# Patient Record
Sex: Female | Born: 1985 | Hispanic: Yes | Marital: Married | Smoking: Never smoker
Health system: Southern US, Community
[De-identification: ages and names within clinical notes are randomized; demographics above are authoritative.]

## PROBLEM LIST (undated history)

## (undated) DIAGNOSIS — N12 Tubulo-interstitial nephritis, not specified as acute or chronic: Secondary | ICD-10-CM

## (undated) DIAGNOSIS — Z789 Other specified health status: Secondary | ICD-10-CM

## (undated) HISTORY — PX: COLONOSCOPY: SHX174

## (undated) HISTORY — PX: NO PAST SURGERIES: SHX2092

---
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
## 2007-02-11 ENCOUNTER — Ambulatory Visit (HOSPITAL_COMMUNITY): Admission: RE | Admit: 2007-02-11 | Discharge: 2007-02-11 | Payer: Self-pay | Admitting: Obstetrics & Gynecology

## 2007-02-20 ENCOUNTER — Ambulatory Visit: Payer: Self-pay | Admitting: Gynecology

## 2007-02-27 ENCOUNTER — Ambulatory Visit: Payer: Self-pay | Admitting: Family Medicine

## 2007-03-06 ENCOUNTER — Ambulatory Visit: Payer: Self-pay | Admitting: Obstetrics and Gynecology

## 2007-03-13 ENCOUNTER — Ambulatory Visit: Payer: Self-pay | Admitting: Family Medicine

## 2007-03-20 ENCOUNTER — Ambulatory Visit: Payer: Self-pay | Admitting: Gynecology

## 2007-03-27 ENCOUNTER — Ambulatory Visit: Payer: Self-pay | Admitting: Family Medicine

## 2007-04-03 ENCOUNTER — Ambulatory Visit: Payer: Self-pay | Admitting: Family Medicine

## 2007-04-11 ENCOUNTER — Ambulatory Visit: Payer: Self-pay | Admitting: Obstetrics & Gynecology

## 2007-04-17 ENCOUNTER — Ambulatory Visit: Payer: Self-pay | Admitting: Family Medicine

## 2007-04-24 ENCOUNTER — Ambulatory Visit: Payer: Self-pay | Admitting: Obstetrics & Gynecology

## 2007-05-02 ENCOUNTER — Ambulatory Visit: Payer: Self-pay | Admitting: Obstetrics & Gynecology

## 2007-05-08 ENCOUNTER — Ambulatory Visit: Payer: Self-pay | Admitting: Family Medicine

## 2007-05-15 ENCOUNTER — Ambulatory Visit: Payer: Self-pay | Admitting: Family Medicine

## 2007-05-22 ENCOUNTER — Ambulatory Visit: Payer: Self-pay | Admitting: Family Medicine

## 2007-05-29 ENCOUNTER — Ambulatory Visit: Payer: Self-pay | Admitting: Obstetrics & Gynecology

## 2007-06-05 ENCOUNTER — Ambulatory Visit: Payer: Self-pay | Admitting: Obstetrics & Gynecology

## 2007-06-12 ENCOUNTER — Ambulatory Visit: Payer: Self-pay | Admitting: *Deleted

## 2007-06-19 ENCOUNTER — Ambulatory Visit: Payer: Self-pay | Admitting: Family Medicine

## 2007-06-21 ENCOUNTER — Inpatient Hospital Stay (HOSPITAL_COMMUNITY): Admission: AD | Admit: 2007-06-21 | Discharge: 2007-06-23 | Payer: Self-pay | Admitting: Obstetrics & Gynecology

## 2007-06-21 ENCOUNTER — Ambulatory Visit: Payer: Self-pay | Admitting: Obstetrics and Gynecology

## 2008-02-04 ENCOUNTER — Emergency Department (HOSPITAL_COMMUNITY): Admission: EM | Admit: 2008-02-04 | Discharge: 2008-02-04 | Payer: Self-pay | Admitting: Family Medicine

## 2010-08-27 NOTE — L&D Delivery Note (Signed)
Delivery Note At 3:48 PM a viable and healthy female was delivered via Vaginal, Spontaneous Delivery (Presentation: LOA).  APGAR: 8, 9; weight 5 pounds 11 ounces .   Placenta status: delivered, intact .  Cord: 3 vessels with the following complications: nuchal cord x 1 and around foot x 1 .  Cord pH: not sent after infant began to cry strongly.  Anesthesia: None Episiotomy: None Lacerations: None Est. Blood Loss (mL): 350 mL  Mom to postpartum.  Baby to room in with mother. PA-S Joseph Berkshire present for delivery and assisting.  Tais Koestner N 06/01/2011, 4:10 PM

## 2011-01-08 ENCOUNTER — Other Ambulatory Visit: Payer: Self-pay | Admitting: Family Medicine

## 2011-01-08 DIAGNOSIS — Z3689 Encounter for other specified antenatal screening: Secondary | ICD-10-CM

## 2011-01-08 LAB — HEPATITIS B SURFACE ANTIGEN: Hepatitis B Surface Ag: NEGATIVE

## 2011-01-08 LAB — RUBELLA ANTIBODY, IGM: Rubella: IMMUNE

## 2011-01-08 LAB — ABO/RH: RH Type: POSITIVE

## 2011-01-08 LAB — RPR: RPR: NONREACTIVE

## 2011-01-12 ENCOUNTER — Ambulatory Visit (HOSPITAL_COMMUNITY)
Admission: RE | Admit: 2011-01-12 | Discharge: 2011-01-12 | Disposition: A | Discharge status: Home / Self Care | Payer: Medicaid Other | Source: Ambulatory Visit | Attending: Family Medicine | Admitting: Family Medicine

## 2011-01-12 DIAGNOSIS — Z363 Encounter for antenatal screening for malformations: Secondary | ICD-10-CM | POA: Insufficient documentation

## 2011-01-12 DIAGNOSIS — Z1389 Encounter for screening for other disorder: Secondary | ICD-10-CM | POA: Insufficient documentation

## 2011-01-12 DIAGNOSIS — Z3689 Encounter for other specified antenatal screening: Secondary | ICD-10-CM

## 2011-01-12 DIAGNOSIS — O358XX0 Maternal care for other (suspected) fetal abnormality and damage, not applicable or unspecified: Secondary | ICD-10-CM | POA: Insufficient documentation

## 2011-01-26 ENCOUNTER — Inpatient Hospital Stay (HOSPITAL_COMMUNITY): Admission: AD | Admit: 2011-01-26 | Payer: Self-pay | Source: Ambulatory Visit | Admitting: Family Medicine

## 2011-05-24 LAB — POCT URINALYSIS DIP (DEVICE)
Bilirubin Urine: NEGATIVE
Glucose, UA: NEGATIVE
Ketones, ur: NEGATIVE
Operator id: 239701
Protein, ur: 30 — AB
Specific Gravity, Urine: 1.01

## 2011-05-24 LAB — URINE CULTURE

## 2011-05-24 LAB — POCT PREGNANCY, URINE: Operator id: 239701

## 2011-06-01 ENCOUNTER — Other Ambulatory Visit: Payer: Self-pay | Admitting: Family Medicine

## 2011-06-01 ENCOUNTER — Encounter (HOSPITAL_COMMUNITY): Payer: Self-pay | Admitting: *Deleted

## 2011-06-01 ENCOUNTER — Ambulatory Visit (HOSPITAL_COMMUNITY): Admission: RE | Admit: 2011-06-01 | Payer: Medicaid Other | Source: Ambulatory Visit

## 2011-06-01 ENCOUNTER — Inpatient Hospital Stay (HOSPITAL_COMMUNITY): Admission: RE | Admit: 2011-06-01 | Payer: Medicaid Other | Source: Ambulatory Visit

## 2011-06-01 ENCOUNTER — Inpatient Hospital Stay (HOSPITAL_COMMUNITY)
Admission: AD | Admit: 2011-06-01 | Discharge: 2011-06-03 | DRG: 775 | Disposition: A | Discharge status: Home / Self Care | Payer: Medicaid Other | Source: Ambulatory Visit | Attending: Obstetrics & Gynecology | Admitting: Obstetrics & Gynecology

## 2011-06-01 DIAGNOSIS — O34219 Maternal care for unspecified type scar from previous cesarean delivery: Secondary | ICD-10-CM | POA: Diagnosis present

## 2011-06-01 DIAGNOSIS — Z22330 Carrier of Group B streptococcus: Secondary | ICD-10-CM

## 2011-06-01 DIAGNOSIS — O99892 Other specified diseases and conditions complicating childbirth: Secondary | ICD-10-CM | POA: Diagnosis present

## 2011-06-01 DIAGNOSIS — O288 Other abnormal findings on antenatal screening of mother: Secondary | ICD-10-CM

## 2011-06-01 LAB — CBC
MCH: 30.7 pg (ref 26.0–34.0)
MCV: 89.2 fL (ref 78.0–100.0)
Platelets: 193 10*3/uL (ref 150–400)
RBC: 4.46 MIL/uL (ref 3.87–5.11)
RDW: 15.5 % (ref 11.5–15.5)
WBC: 10.2 10*3/uL (ref 4.0–10.5)

## 2011-06-01 LAB — RPR: RPR Ser Ql: NONREACTIVE

## 2011-06-01 LAB — ABO/RH: ABO/RH(D): O POS

## 2011-06-01 MED ORDER — PRENATAL PLUS 27-1 MG PO TABS
1.0000 | ORAL_TABLET | Freq: Every day | ORAL | Status: DC
  Administered 2011-06-02: 1 via ORAL
  Filled 2011-06-01: qty 1

## 2011-06-01 MED ORDER — OXYTOCIN 20 UNITS IN LACTATED RINGERS INFUSION - SIMPLE
1.0000 m[IU]/min | INTRAVENOUS | Status: DC
  Administered 2011-06-01: 2 m[IU]/min via INTRAVENOUS

## 2011-06-01 MED ORDER — OXYTOCIN BOLUS FROM INFUSION
500.0000 mL | Freq: Once | INTRAVENOUS | Status: DC
  Filled 2011-06-01: qty 500

## 2011-06-01 MED ORDER — TETANUS-DIPHTH-ACELL PERTUSSIS 5-2.5-18.5 LF-MCG/0.5 IM SUSP
0.5000 mL | Freq: Once | INTRAMUSCULAR | Status: AC
  Administered 2011-06-02: 0.5 mL via INTRAMUSCULAR
  Filled 2011-06-01: qty 0.5

## 2011-06-01 MED ORDER — LANOLIN HYDROUS EX OINT: TOPICAL_OINTMENT | CUTANEOUS | Status: DC | PRN

## 2011-06-01 MED ORDER — LACTATED RINGERS IV SOLN: INTRAVENOUS | Status: DC

## 2011-06-01 MED ORDER — ONDANSETRON HCL 4 MG PO TABS: 4.0000 mg | ORAL_TABLET | ORAL | Status: DC | PRN

## 2011-06-01 MED ORDER — OXYCODONE-ACETAMINOPHEN 5-325 MG PO TABS: 1.0000 | ORAL_TABLET | ORAL | Status: DC | PRN

## 2011-06-01 MED ORDER — DIBUCAINE 1 % RE OINT: 1.0000 "application " | TOPICAL_OINTMENT | RECTAL | Status: DC | PRN

## 2011-06-01 MED ORDER — WITCH HAZEL-GLYCERIN EX PADS: 1.0000 "application " | MEDICATED_PAD | CUTANEOUS | Status: DC | PRN

## 2011-06-01 MED ORDER — BENZOCAINE-MENTHOL 20-0.5 % EX AERO: 1.0000 "application " | INHALATION_SPRAY | CUTANEOUS | Status: DC | PRN

## 2011-06-01 MED ORDER — PENICILLIN G POTASSIUM 5000000 UNITS IJ SOLR
2.5000 10*6.[IU] | INTRAVENOUS | Status: DC
  Filled 2011-06-01 (×4): qty 2.5

## 2011-06-01 MED ORDER — ONDANSETRON HCL 4 MG/2ML IJ SOLN: 4.0000 mg | Freq: Four times a day (QID) | INTRAMUSCULAR | Status: DC | PRN

## 2011-06-01 MED ORDER — IBUPROFEN 600 MG PO TABS
600.0000 mg | ORAL_TABLET | Freq: Four times a day (QID) | ORAL | Status: DC
  Administered 2011-06-02 – 2011-06-03 (×7): 600 mg via ORAL
  Filled 2011-06-01 (×7): qty 1

## 2011-06-01 MED ORDER — DIPHENHYDRAMINE HCL 25 MG PO CAPS: 25.0000 mg | ORAL_CAPSULE | Freq: Four times a day (QID) | ORAL | Status: DC | PRN

## 2011-06-01 MED ORDER — LIDOCAINE HCL (PF) 1 % IJ SOLN
30.0000 mL | INTRAMUSCULAR | Status: DC | PRN
  Filled 2011-06-01 (×2): qty 30

## 2011-06-01 MED ORDER — CITRIC ACID-SODIUM CITRATE 334-500 MG/5ML PO SOLN
30.0000 mL | ORAL | Status: DC | PRN
  Filled 2011-06-01: qty 15

## 2011-06-01 MED ORDER — FLEET ENEMA 7-19 GM/118ML RE ENEM: 1.0000 | ENEMA | RECTAL | Status: DC | PRN

## 2011-06-01 MED ORDER — ACETAMINOPHEN 325 MG PO TABS: 650.0000 mg | ORAL_TABLET | ORAL | Status: DC | PRN

## 2011-06-01 MED ORDER — SIMETHICONE 80 MG PO CHEW: 80.0000 mg | CHEWABLE_TABLET | ORAL | Status: DC | PRN

## 2011-06-01 MED ORDER — ONDANSETRON HCL 4 MG/2ML IJ SOLN: 4.0000 mg | INTRAMUSCULAR | Status: DC | PRN

## 2011-06-01 MED ORDER — IBUPROFEN 600 MG PO TABS
600.0000 mg | ORAL_TABLET | Freq: Four times a day (QID) | ORAL | Status: DC | PRN
  Administered 2011-06-01: 600 mg via ORAL
  Filled 2011-06-01: qty 1

## 2011-06-01 MED ORDER — OXYTOCIN 20 UNITS IN LACTATED RINGERS INFUSION - SIMPLE
125.0000 mL/h | Freq: Once | INTRAVENOUS | Status: AC
  Administered 2011-06-01: 999 mL/h via INTRAVENOUS

## 2011-06-01 MED ORDER — LACTATED RINGERS IV SOLN: 500.0000 mL | INTRAVENOUS | Status: DC | PRN

## 2011-06-01 MED ORDER — OXYTOCIN 20 UNITS IN LACTATED RINGERS INFUSION - SIMPLE
INTRAVENOUS | Status: AC
  Administered 2011-06-01: 999 mL/h via INTRAVENOUS
  Filled 2011-06-01: qty 1000

## 2011-06-01 MED ORDER — PENICILLIN G POTASSIUM 5000000 UNITS IJ SOLR
5.0000 10*6.[IU] | Freq: Once | INTRAVENOUS | Status: AC
  Administered 2011-06-01: 5 10*6.[IU] via INTRAVENOUS
  Filled 2011-06-01: qty 5

## 2011-06-01 MED ORDER — OXYCODONE-ACETAMINOPHEN 5-325 MG PO TABS: 2.0000 | ORAL_TABLET | ORAL | Status: DC | PRN

## 2011-06-01 MED ORDER — ZOLPIDEM TARTRATE 5 MG PO TABS: 5.0000 mg | ORAL_TABLET | Freq: Every evening | ORAL | Status: DC | PRN

## 2011-06-01 MED ORDER — TERBUTALINE SULFATE 1 MG/ML IJ SOLN: 0.2500 mg | Freq: Once | INTRAMUSCULAR | Status: DC | PRN

## 2011-06-01 MED ORDER — SENNOSIDES-DOCUSATE SODIUM 8.6-50 MG PO TABS
2.0000 | ORAL_TABLET | Freq: Every day | ORAL | Status: DC
  Administered 2011-06-01 – 2011-06-02 (×2): 2 via ORAL

## 2011-06-01 MED ORDER — BUTORPHANOL TARTRATE 2 MG/ML IJ SOLN: 1.0000 mg | INTRAMUSCULAR | Status: DC | PRN

## 2011-06-01 NOTE — Progress Notes (Signed)
Jacqueline Watkins is a 25 y.o. G3P2002 at [redacted]w[redacted]d by ultrasound admitted for induction of labor due to late decelerations seen on NST as part of BPP today for decreased fetal movement.  Subjective:   Objective: BP 123/86  Pulse 104  Temp(Src) 98.3 F (36.8 C) (Oral)  Resp 18  Ht 4' 11.5" (1.511 m)  Wt 64.864 kg (143 lb)  BMI 28.40 kg/m2      FHT:  FHR: 126 bpm, variability: moderate,  accelerations:  Present,  decelerations:  Present lates UC:   irregular, every 5-8 minutes SVE:   Dilation: 3.5 Effacement (%): 60 Station: -3 Exam by:: Dr. Natale Watkins  Labs: Lab Results  Component Value Date   WBC 10.2 06/01/2011   HGB 13.7 06/01/2011   HCT 39.8 06/01/2011   MCV 89.2 06/01/2011   PLT 193 06/01/2011    Assessment / Plan: AROM, bloody and meconium stained; IUPC and FSE placed. Pt rescitated with bolus, O2 and position change, pitocin turned off. Will restart pitocin in approximately one hour if tracing remains reassuring.  Fetal Wellbeing:  Category II Pain Control:  Labor support without medications I/D:  PCN for GBS positive Anticipated MOD:  NSVD  Jacqueline Watkins N 06/01/2011, 1:54 PM

## 2011-06-01 NOTE — H&P (Signed)
Jacqueline Watkins is a 25 y.o. female presenting for IOL  The patient speaks only Spanish, so the information is obtained through an interpreter.  Maternal Medical History:  Reason for admission: Jacqueline Watkins is a 26 year old G3P2002 at 71.4 who was sent to [facility] from the [facility] for IOL. During a BPP at [facility], where the patient was receiving prenatal care, the fetus was monitored having late decelerations. Upon arrival the patient states that she is not in pain and does not feel strong contractions. She only endorses pressure in her lower abdomen and a mild frontal headache. She denies LOF or bleeding. No nausea, vomiting, fever, chills, CP, SOB, or changes in vision.   Contractions: Frequency: irregular.   Perceived severity is moderate.    Prenatal complications: The patient notes that she had pyelonephritis during her second month of pregnancy and was treated with antibiotics. She states that she does not know the name of the antibiotics, but knows that the infection resolved.    Ob History: [id number]; 1st pregnancy delivered [place] for PROM according to the patient; Her 2nd delivery was VBAC without complications at [facility]    No past medical history on file. Past Surgical History  Procedure Date  . No past surgeries    Family History: family history includes Hypertension in her mother. Social History:  reports that she has never smoked. She has never used smokeless tobacco. She reports that she does not drink alcohol or use illicit drugs.  Review of Systems  All other systems reviewed and are negative.    Dilation: 3 Effacment: 50% Station: -3   Blood pressure 103/61, pulse 72, temperature 98.6 F (37 C), temperature source Oral, resp. rate 18, height 4' 11.5" (1.511 m), weight 143 lb (64.864 kg), unknown if currently breastfeeding. Maternal Exam:  Uterine Assessment: Contraction strength is moderate.  Abdomen: Patient reports no abdominal tenderness. Fetal  presentation: vertex  Introitus: Normal vulva. Normal vagina.    Fetal Exam Fetal Monitor Review: Mode: fetoscope.   Baseline rate: 130's.  Variability: moderate (6-25 bpm).   Pattern: accelerations present.       Physical Exam  Constitutional: She appears well-developed and well-nourished. No distress.  HENT:  Head: Normocephalic and atraumatic.  Mouth/Throat: No oropharyngeal exudate.  Cardiovascular: Normal rate and regular rhythm.   No murmur heard. Respiratory: Effort normal and breath sounds normal.  GI:       Gravid    Prenatal labs: ABO, Rh: O/Positive/-- (05/14 0000) Antibody: Negative (05/14 0000) Rubella: Immune (05/14 0000) RPR: Nonreactive (05/14 0000)  HBsAg: Negative (05/14 0000)  HIV: Non-reactive, Non-reactive (05/14 0000)  GBS: Positive (09/21 0000)   Assessment/Plan: 1. [facility] to the [facility] at [facility], Labor & Delivery suite, under attending physician Dr. Nicholaus Bloom  2. Monitors: Fetoscope, Tocometer - continous 3. IOL: Pitocin 4. Pain Control: Epidural when needed, stadol PRN 5. GBS + : start PCN G 6. FENGI: NPO, IVF LR  Jacqueline Watkins 06/01/2011, 5:35 PM

## 2011-06-01 NOTE — Progress Notes (Signed)
Jacqueline Watkins is a 25 y.o. G3P2002 at [redacted]w[redacted]d by ultrasound admitted for induction of labor due to non reassuring NST at [facility]  Subjective:   Objective: BP 114/75  Pulse 84  Temp(Src) 98.3 F (36.8 Watkins) (Oral)  Resp 18  Ht 4' 11.5" (1.511 m)  Wt 64.864 kg (143 lb)  BMI 28.40 kg/m2      FHT:  FHR: 140s bpm, variability: moderate,  accelerations:  Present,  decelerations:  Present occasional lates and occasional mild variable UC:   regular, every SVE:   Dilation: 4.5 Effacement (%): 90 Station: -1 Exam by:: [employer]  Labs: Lab Results  Component Value Date   WBC 10.2 06/01/2011   HGB 13.7 06/01/2011   HCT 39.8 06/01/2011   MCV 89.2 06/01/2011   PLT 193 06/01/2011    Assessment / Plan: Induction of labor due to non reassuring NST,  progressing well on pitocin  Labor: Progressing normally Fetal Wellbeing:  Category II Pain Control:  Labor support without medications I/D:  PCN for + GBS Anticipated MOD:  NSVD  Jacqueline Watkins. 06/01/2011, 2:34 PM

## 2011-06-01 NOTE — Consult Note (Signed)
Neonatology Note:   Attendance at Delivery:    I was asked to attend this NSVD at term due to meconium-stained fluid and FHR decels. The mother is a G3P2 GBS positive who received antibiotics 3 hours PTD, but remained afebrile during labor. I did bulb suctioning of the infant before the first cry as the cord was being clamped. Infant vigorous with good spontaneous cry and tone, but blue color, gradually pinking up. Needed only minimal bulb suctioning. Ap 8/9. Lungs clear to ausc in DR. Baby appears SGA, may be constitutional. I spoke to the parents and let them know that we would need to check blood glucose levels due to his small size. To CN to care of Pediatrician.   Deatra James, MD

## 2011-06-02 NOTE — Progress Notes (Signed)
Post Partum Day 1 Subjective: no complaints, up ad lib, voiding and tolerating PO, pain well controlled.  Objective: Blood pressure 100/64, pulse 68, temperature 97.7 F (36.5 C), temperature source Oral, resp. rate 18, height 4' 11.5" (1.511 m), weight 64.864 kg (143 lb), SpO2 98.00%, unknown if currently breastfeeding.  Physical Exam:  General: alert, cooperative and no distress Heart: regular rate, no murmur Lungs: clear to auscultation bilaterally, no wheezing. Lochia: appropriate Uterine Fundus: firm Incision: no incision DVT Evaluation: No cords or calf tenderness. No significant calf/ankle edema.   Basename 06/01/11 1136  HGB 13.7  HCT 39.8    Assessment/Plan: Plan for discharge tomorrow, Breastfeeding and Lactation consult   LOS: 1 day   Jacqueline Watkins 06/02/2011, 8:40 AM

## 2011-06-03 MED ORDER — IBUPROFEN 600 MG PO TABS: 600.0000 mg | ORAL_TABLET | Freq: Four times a day (QID) | ORAL | Status: AC

## 2011-06-03 MED ORDER — NORETHINDRONE 0.35 MG PO TABS: 1.0000 | ORAL_TABLET | Freq: Every day | ORAL | Status: DC

## 2011-06-03 NOTE — Discharge Summary (Signed)
Obstetric Discharge Summary Reason for Admission: induction of labor due to late decels during NST. Prenatal Procedures: NST and ultrasound Intrapartum Procedures: VBAC Postpartum Procedures: none Complications-Operative and Postpartum: none Hemoglobin  Date Value Range Status  06/01/2011 13.7  12.0-15.0 (g/dL) Final     HCT  Date Value Range Status  06/01/2011 39.8  36.0-46.0 (%) Final    Discharge Diagnoses: Term Pregnancy-delivered  Discharge Information: Date: 06/03/2011 Activity: pelvic rest Diet: routine Medications: Ibuprofen and Micronor Condition: stable Instructions: Given postpartum handout provided in epic Discharge to: home Follow-up Information    Follow up with [facility]. Make an appointment in 6 weeks.         Newborn Data: Live born female  Birth Weight: 5 lb 11.2 oz (2586 g) APGAR: 8, 9  Home with mother.  [facility] 06/03/2011, 7:36 AM

## 2011-06-03 NOTE — Progress Notes (Signed)
Post Partum Day 2 Subjective: no complaints, up ad lib, voiding, tolerating PO and breast/bottle feeding, desires OCPs for contraception.  Objective: Blood pressure 90/55, pulse 76, temperature 97.8 F (36.6 C), temperature source Oral, resp. rate 18, height 4' 11.5" (1.511 m), weight 64.864 kg (143 lb), SpO2 98.00%, unknown if currently breastfeeding.  Physical Exam:  General: alert, cooperative and appears stated age Lochia: appropriate Uterine Fundus: FF 2 below umbilicus Incision: n/a DVT Evaluation: No evidence of DVT seen on physical exam.   Basename 06/01/11 1136  HGB 13.7  HCT 39.8    Assessment/Plan: Discharge home, Breastfeeding and Contraception ocps   LOS: 2 days   [facility] 06/03/2011, 7:30 AM

## 2011-06-04 NOTE — Progress Notes (Signed)
UR chart review completed.  

## 2011-06-06 LAB — CBC
HCT: 39.4
MCHC: 33.8
MCV: 88
Platelets: 283
RDW: 15.8 — ABNORMAL HIGH

## 2011-06-06 LAB — POCT URINALYSIS DIP (DEVICE)
Hgb urine dipstick: NEGATIVE
Ketones, ur: NEGATIVE
Ketones, ur: NEGATIVE
Protein, ur: 100 — AB
Protein, ur: 30 — AB
Specific Gravity, Urine: 1.01
pH: 7
pH: 7

## 2011-06-07 LAB — POCT URINALYSIS DIP (DEVICE)
Bilirubin Urine: NEGATIVE
Ketones, ur: NEGATIVE
Nitrite: NEGATIVE
Nitrite: NEGATIVE
Protein, ur: 100 — AB
Specific Gravity, Urine: 1.015
Specific Gravity, Urine: 1.015
Urobilinogen, UA: 0.2
Urobilinogen, UA: 0.2
pH: 7
pH: 7.5
pH: 7

## 2011-06-08 LAB — POCT URINALYSIS DIP (DEVICE)
Bilirubin Urine: NEGATIVE
Ketones, ur: NEGATIVE
pH: 7.5

## 2011-06-11 LAB — POCT URINALYSIS DIP (DEVICE)
Bilirubin Urine: NEGATIVE
Bilirubin Urine: NEGATIVE
Bilirubin Urine: NEGATIVE
Glucose, UA: NEGATIVE
Ketones, ur: NEGATIVE
Ketones, ur: NEGATIVE
Ketones, ur: NEGATIVE
Operator id: 120861
Operator id: 120861
Operator id: 159681

## 2011-06-12 LAB — POCT URINALYSIS DIP (DEVICE)
Bilirubin Urine: NEGATIVE
Ketones, ur: NEGATIVE
Nitrite: NEGATIVE
Operator id: 159681
Protein, ur: 100 — AB

## 2011-06-13 LAB — POCT URINALYSIS DIP (DEVICE)
Bilirubin Urine: NEGATIVE
Ketones, ur: NEGATIVE
Nitrite: NEGATIVE
Operator id: 120861
Protein, ur: 100 — AB

## 2013-05-23 ENCOUNTER — Encounter (HOSPITAL_COMMUNITY): Payer: Self-pay

## 2013-05-23 ENCOUNTER — Emergency Department (HOSPITAL_COMMUNITY)
Admission: EM | Admit: 2013-05-23 | Discharge: 2013-05-23 | Disposition: A | Discharge status: Home / Self Care | Payer: Self-pay | Attending: Emergency Medicine | Admitting: Emergency Medicine

## 2013-05-23 ENCOUNTER — Other Ambulatory Visit: Payer: Self-pay

## 2013-05-23 ENCOUNTER — Emergency Department (HOSPITAL_COMMUNITY): Payer: Self-pay

## 2013-05-23 ENCOUNTER — Emergency Department (HOSPITAL_COMMUNITY): Payer: Medicaid Other

## 2013-05-23 DIAGNOSIS — R51 Headache: Secondary | ICD-10-CM | POA: Insufficient documentation

## 2013-05-23 DIAGNOSIS — R209 Unspecified disturbances of skin sensation: Secondary | ICD-10-CM | POA: Insufficient documentation

## 2013-05-23 DIAGNOSIS — R079 Chest pain, unspecified: Secondary | ICD-10-CM | POA: Insufficient documentation

## 2013-05-23 LAB — POCT I-STAT TROPONIN I: Troponin i, poc: 0 ng/mL (ref 0.00–0.08)

## 2013-05-23 LAB — COMPREHENSIVE METABOLIC PANEL
Albumin: 4.2 g/dL (ref 3.5–5.2)
Alkaline Phosphatase: 97 U/L (ref 39–117)
BUN: 7 mg/dL (ref 6–23)
Calcium: 9.1 mg/dL (ref 8.4–10.5)
Chloride: 107 mEq/L (ref 96–112)
Creatinine, Ser: 0.66 mg/dL (ref 0.50–1.10)
Total Bilirubin: 0.5 mg/dL (ref 0.3–1.2)
Total Protein: 8.2 g/dL (ref 6.0–8.3)

## 2013-05-23 LAB — CBC WITH DIFFERENTIAL/PLATELET
Basophils Absolute: 0 10*3/uL (ref 0.0–0.1)
Basophils Relative: 0 % (ref 0–1)
Eosinophils Absolute: 0.3 10*3/uL (ref 0.0–0.7)
Eosinophils Relative: 3 % (ref 0–5)
HCT: 33.9 % — ABNORMAL LOW (ref 36.0–46.0)
Hemoglobin: 11.3 g/dL — ABNORMAL LOW (ref 12.0–15.0)
Lymphs Abs: 2.4 10*3/uL (ref 0.7–4.0)
MCH: 25.1 pg — ABNORMAL LOW (ref 26.0–34.0)
MCHC: 33.3 g/dL (ref 30.0–36.0)
MCV: 75.2 fL — ABNORMAL LOW (ref 78.0–100.0)
Monocytes Absolute: 0.6 10*3/uL (ref 0.1–1.0)
Monocytes Relative: 6 % (ref 3–12)
Neutro Abs: 6.2 10*3/uL (ref 1.7–7.7)
RDW: 15.2 % (ref 11.5–15.5)

## 2013-05-23 MED ORDER — METOCLOPRAMIDE HCL 10 MG PO TABS: 10.0000 mg | ORAL_TABLET | Freq: Four times a day (QID) | ORAL | Status: DC

## 2013-05-23 MED ORDER — DIPHENHYDRAMINE HCL 50 MG/ML IJ SOLN
25.0000 mg | Freq: Once | INTRAMUSCULAR | Status: AC
  Administered 2013-05-23: 25 mg via INTRAVENOUS
  Filled 2013-05-23: qty 1

## 2013-05-23 MED ORDER — METOCLOPRAMIDE HCL 5 MG/ML IJ SOLN
10.0000 mg | Freq: Once | INTRAMUSCULAR | Status: AC
  Administered 2013-05-23: 10 mg via INTRAVENOUS
  Filled 2013-05-23: qty 2

## 2013-05-23 MED ORDER — SODIUM CHLORIDE 0.9 % IV BOLUS (SEPSIS)
1000.0000 mL | Freq: Once | INTRAVENOUS | Status: AC
  Administered 2013-05-23: 1000 mL via INTRAVENOUS

## 2013-05-23 NOTE — ED Notes (Signed)
Pt reports headache that started today 2 hours ago.  Reports some numbness to left side of face.  Also reporting some chills that started two hours ago as well. Denies fever, abdominal pain, n/v/d, sob.  Sts she has been feeling dizzy and weak and with chest pressure.  No radiation.  Sts chest pressure started two hours ago as well.  Pain 8/10.

## 2013-05-23 NOTE — ED Provider Notes (Signed)
CSN: [id number]     Arrival date & time 05/23/13  1191 History   First MD Initiated Contact with Patient 05/23/13 [phone]     Chief Complaint  Patient presents with  . Headache  . Chest Pain   (Consider location/radiation/quality/duration/timing/severity/associated sxs/prior Treatment) The history is provided by the patient. A language interpreter was used Conservation officer, historic buildings ).  Jacqueline Watkins is a 27 y.o. female here presenting with headache and numbness in the left face and chest pain. She's been having headache for the last day or so that's gradual in onset and slowly got worse. She was unable to sleep due to the pain. Also felt that her left face was slightly numb but denies any weakness. He does have a history of headaches and this is similar to her previous headaches. She also had a episode of chest pressure during the headache but denies any shortness of breath. As any radiation with the pain.   History reviewed. No pertinent past medical history. Past Surgical History  Procedure Laterality Date  . No past surgeries     Family History  Problem Relation Age of Onset  . Hypertension Mother    History  Substance Use Topics  . Smoking status: Never Smoker   . Smokeless tobacco: Never Used  . Alcohol Use: No   OB History   Grav Para Term Preterm Abortions TAB SAB Ect Mult Living   3 3 3  0 0 0 0 0 0 3     Review of Systems  Cardiovascular: Positive for chest pain.  Neurological: Positive for headaches.  All other systems reviewed and are negative.    Allergies  Review of patient's allergies indicates no known allergies.  Home Medications   Current Outpatient Rx  Name  Route  Sig  Dispense  Refill  . ibuprofen (ADVIL,MOTRIN) 200 MG tablet   Oral   Take 200 mg by mouth every 6 (six) hours as needed for pain.          BP 127/88  Pulse 96  Temp(Src) 98.3 F (36.8 C) (Oral)  Resp 18  SpO2 100%  LMP 04/27/2013 Physical Exam  Nursing note and vitals  reviewed. Constitutional: She is oriented to person, place, and time. She appears well-developed and well-nourished.  Slightly uncomfortable   HENT:  Head: Normocephalic.  Mouth/Throat: Oropharynx is clear and moist.  Eyes: Conjunctivae are normal. Pupils are equal, round, and reactive to light.  Mild photophobia   Neck: Normal range of motion. Neck supple.  Cardiovascular: Normal rate, regular rhythm and normal heart sounds.   Pulmonary/Chest: Effort normal and breath sounds normal. No respiratory distress. She has no wheezes. She has no rales.  Abdominal: Soft. Bowel sounds are normal. She exhibits no distension. There is no tenderness. There is no rebound.  Musculoskeletal: Normal range of motion.  Neurological: She is alert and oriented to person, place, and time.  Slightly dec sensation L face, nl strength in all extremities. Nl finger to nose.   Skin: Skin is warm and dry.  Psychiatric: She has a normal mood and affect. Her behavior is normal. Judgment and thought content normal.    ED Course  Procedures (including critical care time) Labs Review Labs Reviewed  CBC WITH DIFFERENTIAL - Abnormal; Notable for the following:    Hemoglobin 11.3 (*)    HCT 33.9 (*)    MCV 75.2 (*)    MCH 25.1 (*)    All other components within normal limits  COMPREHENSIVE METABOLIC PANEL -  Abnormal; Notable for the following:    Potassium 3.3 (*)    CO2 18 (*)    Glucose, Bld 104 (*)    All other components within normal limits  POCT I-STAT TROPONIN I   Imaging Review Dg Chest 2 View  05/23/2013   *RADIOLOGY REPORT*  Clinical Data: Chest pain  CHEST - 2 VIEW  Comparison: None.  Findings: Cardiac and mediastinal silhouettes are within normal limits.  The lungs are normally inflated.  No airspace consolidation, pleural effusion, or pulmonary edema is identified.  There is no pneumothorax.  No acute osseous abnormality identified.  IMPRESSION: No acute cardiopulmonary process.   Original Report  Authenticated By: Rise Mu, M.D.   Ct Head Wo Contrast  05/23/2013   CLINICAL DATA:  Left-sided facial numbness and frontal headache.  EXAM: CT HEAD WITHOUT CONTRAST  TECHNIQUE: Contiguous axial images were obtained from the base of the skull through the vertex without intravenous contrast.  COMPARISON:  None.  FINDINGS: Sinuses/Soft tissues: Clear paranasal sinuses and mastoid air cells.  Intracranial: No mass lesion, hemorrhage, hydrocephalus, acute infarct, intra-axial, or extra-axial fluid collection.  IMPRESSION: Normal head CT.   Electronically Signed   By: Jeronimo Greaves   On: 05/23/2013 04:22   Date: 05/23/2013  Rate: 85  Rhythm: normal sinus rhythm  QRS Axis: normal  Intervals: normal  ST/T Wave abnormalities: normal  Conduction Disutrbances:none  Narrative Interpretation:   Old EKG Reviewed: none available    MDM  No diagnosis found. Jacqueline Watkins is a 27 y.o. female here with headache, chest pain. I think she likely has migraines. She has no previous imaging and L face numbness is new so CT head ordered and was normal. Headache improved with reglan. I doubt subarachnoid and I don't think she needs LP. Chest pain is likely from the headache. EKG unremarkable, trop neg x 1 and I doubt she has ACS and she is PERC neg so d-dimer not needed. Stable for d/c with outpatient f/u.     Richardean Canal, MD 05/23/13 [phone]

## 2013-05-23 NOTE — ED Notes (Signed)
Interpretor phones used to assess patient.

## 2014-06-28 ENCOUNTER — Encounter (HOSPITAL_COMMUNITY): Payer: Self-pay

## 2014-07-30 ENCOUNTER — Emergency Department (HOSPITAL_COMMUNITY)
Admission: EM | Admit: 2014-07-30 | Discharge: 2014-07-31 | Disposition: A | Discharge status: Home / Self Care | Payer: Self-pay | Attending: Emergency Medicine | Admitting: Emergency Medicine

## 2014-07-30 ENCOUNTER — Encounter (HOSPITAL_COMMUNITY): Payer: Self-pay | Admitting: Emergency Medicine

## 2014-07-30 DIAGNOSIS — J36 Peritonsillar abscess: Secondary | ICD-10-CM | POA: Insufficient documentation

## 2014-07-30 DIAGNOSIS — R112 Nausea with vomiting, unspecified: Secondary | ICD-10-CM | POA: Insufficient documentation

## 2014-07-30 DIAGNOSIS — J029 Acute pharyngitis, unspecified: Secondary | ICD-10-CM

## 2014-07-30 DIAGNOSIS — Z79899 Other long term (current) drug therapy: Secondary | ICD-10-CM | POA: Insufficient documentation

## 2014-07-30 DIAGNOSIS — H9209 Otalgia, unspecified ear: Secondary | ICD-10-CM | POA: Insufficient documentation

## 2014-07-30 LAB — RAPID STREP SCREEN (MED CTR MEBANE ONLY): Streptococcus, Group A Screen (Direct): NEGATIVE

## 2014-07-30 NOTE — ED Provider Notes (Signed)
CSN: [id number]     Arrival date & time 07/30/14  2234 History   First MD Initiated Contact with Patient 07/30/14 2304     Chief Complaint  Patient presents with  . Sore Throat  . Otalgia     (Consider location/radiation/quality/duration/timing/severity/associated sxs/prior Treatment) Patient is a 28 y.o. female presenting with pharyngitis and ear pain. The history is provided by the patient and a relative.  Sore Throat This is a new problem. The current episode started in the past 7 days. The problem occurs constantly. The problem has been gradually worsening. Associated symptoms include myalgias, nausea and vomiting. Pertinent negatives include no fever. The symptoms are aggravated by swallowing.  Otalgia Associated symptoms: vomiting   Associated symptoms: no fever     History reviewed. No pertinent past medical history. Past Surgical History  Procedure Laterality Date  . No past surgeries    . Cesarean section     Family History  Problem Relation Age of Onset  . Hypertension Mother    History  Substance Use Topics  . Smoking status: Never Smoker   . Smokeless tobacco: Never Used  . Alcohol Use: No   OB History    Gravida Para Term Preterm AB TAB SAB Ectopic Multiple Living   3 3 3  0 0 0 0 0 0 3     Review of Systems  Constitutional: Negative for fever.  HENT: Positive for ear pain, trouble swallowing and voice change.   Gastrointestinal: Positive for nausea and vomiting.  Musculoskeletal: Positive for myalgias.  All other systems reviewed and are negative.     Allergies  Review of patient's allergies indicates no known allergies.  Home Medications   Prior to Admission medications   Medication Sig Start Date End Date Taking? Authorizing Provider  ibuprofen (ADVIL,MOTRIN) 200 MG tablet Take 200 mg by mouth every 6 (six) hours as needed for pain.    Historical Provider, MD  metoCLOPramide (REGLAN) 10 MG tablet Take 1 tablet (10 mg total) by mouth every 6  (six) hours. 05/23/13   Wandra Arthurs, MD   BP 123/84 mmHg  Pulse 130  Temp(Src) 99.6 F (37.6 C) (Oral)  Resp 18  SpO2 99%  LMP 07/24/2014 Physical Exam  Constitutional: She is oriented to person, place, and time. She appears well-developed and well-nourished.  HENT:  Head: Normocephalic.  Muffled voice.  Mild trismus--unable to open mouth enough to clearly visualize oropharynx.  Eyes: Conjunctivae are normal.  Neck: Neck supple.  Cardiovascular: Normal rate and regular rhythm.   Pulmonary/Chest: Effort normal and breath sounds normal.  Abdominal: Soft. Bowel sounds are normal.  Musculoskeletal: She exhibits no edema.  Lymphadenopathy:    She has cervical adenopathy.  Neurological: She is alert and oriented to person, place, and time.  Skin: Skin is warm and dry.  Psychiatric: She has a normal mood and affect.  Nursing note and vitals reviewed.   ED Course  Procedures (including critical care time) Labs Review Labs Reviewed  RAPID STREP SCREEN  CULTURE, GROUP A STREP    Imaging Review No results found.   EKG Interpretation None     Discussed patient with Dr. Christy Gentles. CT results reviewed and shared with patient/family via interpreter.  Patient non-toxic appearing---has received decadron and toradol.  Is able to swallow and maintain po fluid intake.  No airway compromise. Also discussed treatment plan (dose of Iv antibiotic in ED, continuation of antibiotic at home, home care) and return precautions via interpreter. Written instructions provided in Spanish.  MDM   Final diagnoses:  None   Peritonsillar abscess.    Norman Herrlich, NP 07/31/14 [id number]  Carmin Muskrat, MD 08/02/14 [phone]

## 2014-07-30 NOTE — ED Notes (Signed)
Pt. reports sore throat / swelling and right ear ache onset 4 days ago , denies cough or congestion , no fever or chills.

## 2014-07-31 ENCOUNTER — Emergency Department (HOSPITAL_COMMUNITY): Payer: Medicaid Other

## 2014-07-31 ENCOUNTER — Encounter (HOSPITAL_COMMUNITY): Payer: Self-pay | Admitting: Radiology

## 2014-07-31 LAB — CBC WITH DIFFERENTIAL/PLATELET
BASOS ABS: 0 10*3/uL (ref 0.0–0.1)
Basophils Relative: 0 % (ref 0–1)
Eosinophils Absolute: 0.2 10*3/uL (ref 0.0–0.7)
Eosinophils Relative: 1 % (ref 0–5)
HEMATOCRIT: 34.9 % — AB (ref 36.0–46.0)
HEMOGLOBIN: 11.2 g/dL — AB (ref 12.0–15.0)
Lymphocytes Relative: 13 % (ref 12–46)
Lymphs Abs: 2.4 10*3/uL (ref 0.7–4.0)
MCH: 23.6 pg — ABNORMAL LOW (ref 26.0–34.0)
MCHC: 32.1 g/dL (ref 30.0–36.0)
MCV: 73.6 fL — ABNORMAL LOW (ref 78.0–100.0)
MONOS PCT: 9 % (ref 3–12)
Monocytes Absolute: 1.6 10*3/uL — ABNORMAL HIGH (ref 0.1–1.0)
NEUTROS ABS: 14 10*3/uL — AB (ref 1.7–7.7)
NEUTROS PCT: 77 % (ref 43–77)
Platelets: 379 10*3/uL (ref 150–400)
RBC: 4.74 MIL/uL (ref 3.87–5.11)
RDW: 15.7 % — ABNORMAL HIGH (ref 11.5–15.5)
WBC: 18.2 10*3/uL — AB (ref 4.0–10.5)

## 2014-07-31 MED ORDER — CLINDAMYCIN PHOSPHATE 600 MG/50ML IV SOLN
600.0000 mg | Freq: Once | INTRAVENOUS | Status: AC
  Administered 2014-07-31: 600 mg via INTRAVENOUS

## 2014-07-31 MED ORDER — IOHEXOL 300 MG/ML  SOLN
75.0000 mL | Freq: Once | INTRAMUSCULAR | Status: AC | PRN
  Administered 2014-07-31: 75 mL via INTRAVENOUS

## 2014-07-31 MED ORDER — KETOROLAC TROMETHAMINE 30 MG/ML IJ SOLN
30.0000 mg | Freq: Once | INTRAMUSCULAR | Status: AC
  Administered 2014-07-31: 30 mg via INTRAVENOUS

## 2014-07-31 MED ORDER — DEXAMETHASONE SODIUM PHOSPHATE 10 MG/ML IJ SOLN
10.0000 mg | Freq: Once | INTRAMUSCULAR | Status: AC
  Administered 2014-07-31: 10 mg via INTRAVENOUS

## 2014-07-31 MED ORDER — CLINDAMYCIN HCL 300 MG PO CAPS: 300.0000 mg | ORAL_CAPSULE | Freq: Four times a day (QID) | ORAL | Status: DC

## 2014-07-31 NOTE — Discharge Instructions (Signed)
Absceso periamigdalino (Peritonsillar Abscess) El absceso periamigdalino es la acumulacin de pus en la parte posterior de la garganta, detrs de las [place]. Generalmente se produce cuando una infeccin por estreptococo en la garganta o en las amgdalas se disemina en el espacio que rodea a las amgdalas. Casi siempre la causa es la bacteria estreptococo (germen). El tratamiento ms frecuente para esta enfermedad es el drenaje; para ello se coloca una aguja en el absceso o se le hace una incisin (corte) y luego se lo drena. Por lo general, despus se indica un tratamiento con antibiticos. INSTRUCCIONES PARA EL CUIDADO DOMICILIARIO  Si el profesional le ha drenado el absceso en el da de hoy, enjuague la garganta (grgaras) con una solucin de agua tibia con sal, cuatro veces por da, o segn lo necesario hasta obtener alivio. No trague esta solucin. Mezcle 1 cucharadita de sal en 220 cc (8 onzas) de agua tibia y haga grgaras.  Haga reposo en cama todo lo que pueda. Reanude las actividades cuando se sienta en condiciones.  Aplique fro en el cuello para Best boy. Llene una bolsa plstica con hielo y envulvala en una toalla. Aplique el hielo sobre el cuello durante 20 minutos, 4 veces por da.  Consuma una dieta blanda o lquida segn la tolerancia, mientras sienta dolor en la garganta. Una buena eleccin pueden ser los Popsicles y los helados. Beber gran cantidad de lquidos fros probablemente lo aliviar y lo ayudar a disminuir la hinchazn si lo hace entre las Engineer, petroleum.  Utilice los medicamentos de venta libre o de prescripcin para Conservation officer, historic buildings, Health and safety inspector o la [place], segn se lo indique el profesional que lo asiste. No tome aspirina a menos que se lo haya indicado el mdico. La aspirina hace ms lento el proceso de coagulacin y Product/process development scientist hemorragias en la zona del drenaje, si la incisin fue practicada en el da de hoy.  Si le prescribieron antibiticos, tmelos segn  le hayan indicado hasta completar el tratamiento, aunque se sienta mejor. SOLICITE ATENCIN MDICA SI:  El dolor, la hinchazn, la inflamacin o el drenaje [place].  Presenta signos de infeccin, como mareos, dolor de [place], Best boy o sensacin de Pharmacist, hospital.  Presenta dificultades para tragar o comer.  Muestra sntomas de deshidratacin (mareos al pararse, menor cantidad de [place], ritmo cardaco elevado, o sequedad en la boca y las membranas mucosas). SOLICITE ATENCIN MDICA DE INMEDIATO SI:  Tiene fiebre.  [place].  El dolor de [place] y no puede controlarlo con los [place], o comienza a Buyer, retail.  Presenta dificultad para respirar o para hablar, o le resulta ms fcil respirar si se inclina hacia delante. Document Released: 05/23/2005 Document Revised: 11/05/2011 [facility] Patient Information 2015 [place]. This information is not intended to replace advice given to you by your health care provider. Make sure you discuss any questions you have with your health care provider. Peritonsillar Abscess A peritonsillar abscess is a collection of pus located in the back of the throat behind the tonsils. It usually occurs when a streptococcal infection of the throat or tonsils spreads into the space around the tonsils. They are almost always caused by the streptococcal germ (bacteria). The treatment of a peritonsillar abscess is most often drainage accomplished by putting a needle into the abscess or cutting (incising) and draining the abscess. This is most often followed with a course of antibiotics. HOME CARE INSTRUCTIONS  If your abscess was drained by your caregiver today, rinse your throat (gargle)  with warm salt water four times per day or as needed for comfort. Do not swallow this mixture. Mix 1 teaspoon of salt in 8 ounces of warm water for gargling.  Rest in bed as needed. Resume activities as able.  Apply cold to your neck for pain  relief. Fill a plastic bag with ice and wrap it in a towel. Hold the ice on your neck for 20 minutes 4 times per day.  Eat a soft or liquid diet as tolerated while your throat remains sore. Popsicles and ice cream may be good early choices. Drinking plenty of cold fluids will probably be soothing and help take swelling down in between the warm gargles.  Only take over-the-counter or prescription medicines for pain, discomfort, or fever as directed by your caregiver. Do not use aspirin unless directed by your physician. Aspirin slows down the clotting process. It can also cause bleeding from the drainage area if this was needled or incised today.  If antibiotics were prescribed, take them as directed for the full course of the prescription. Even if you feel you are well, you need to take them. SEEK MEDICAL CARE IF:   You have increased pain, swelling, redness, or drainage in your throat.  You develop signs of infection such as dizziness, headache, lethargy, or generalized feelings of illness.  You have difficulty breathing, swallowing or eating.  You show signs of becoming dehydrated (lightheadedness when standing, decreased urine output, a fast heart rate, or dry mouth and mucous membranes). SEEK IMMEDIATE MEDICAL CARE IF:   You have a fever.  You are coughing up or vomiting blood.  You develop more severe throat pain uncontrolled with medicines or you start to drool.  You develop difficulty breathing, talking, or find it easier to breathe while leaning forward. Document Released: 08/13/2005 Document Revised: 11/05/2011 Document Reviewed: 03/26/2008 [facility] Patient Information 2015 [place], [place]. This information is not intended to replace advice given to you by your health care provider. Make sure you discuss any questions you have with your health care provider.

## 2014-07-31 NOTE — ED Notes (Signed)
Per interpreter: Pt c/o sore throat and R ear pain x 4 days. Also had one day of NVD which has subsided. Pt c/o bodyaches.

## 2014-08-01 LAB — CULTURE, GROUP A STREP

## 2014-10-06 IMAGING — CT CT HEAD W/O CM
1 series · 16 of 30 positions shown, 20 images · non-contrast
Comparison: None.

CLINICAL DATA: Left-sided facial numbness and frontal headache.

EXAM:
CT HEAD WITHOUT CONTRAST
TECHNIQUE: Contiguous axial images were obtained from the base of the skull
through the vertex without intravenous contrast.

[Series 2: head 5.0 h30s · axial · 0.42mm/px · z∈[-73,+62]mm · 16 of 30 slices shown, 20 images]
[im 2/30  brain]
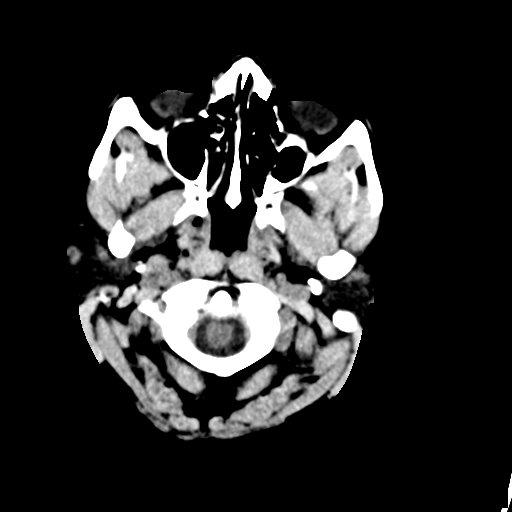
[im 2/30  bone]
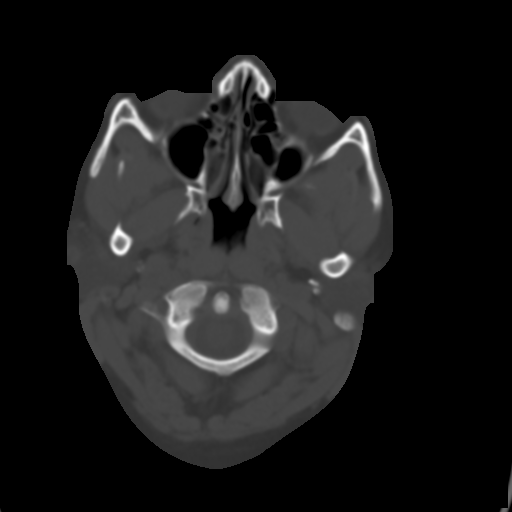
[im 4/30  brain]
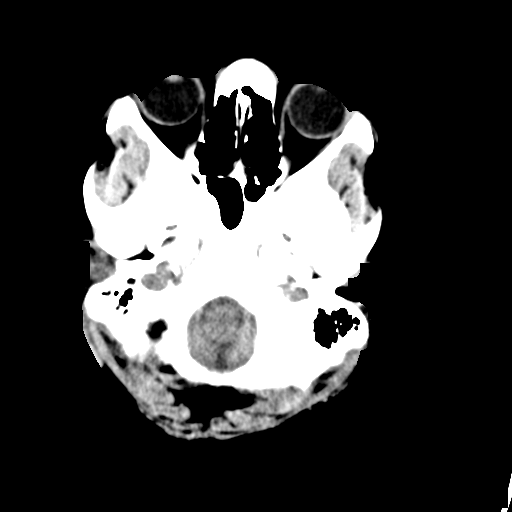
[im 6/30  brain]
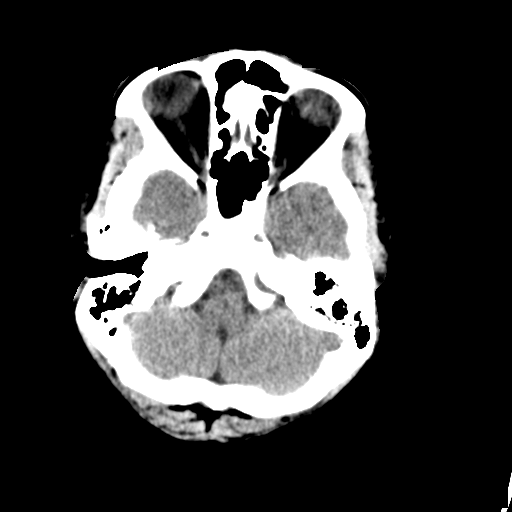
[im 8/30  brain]
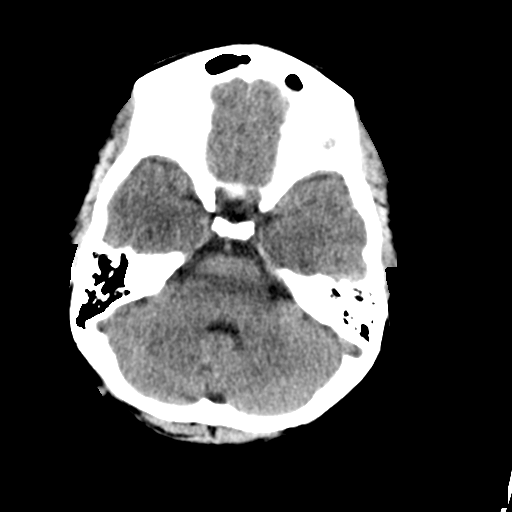
[im 9/30  brain]
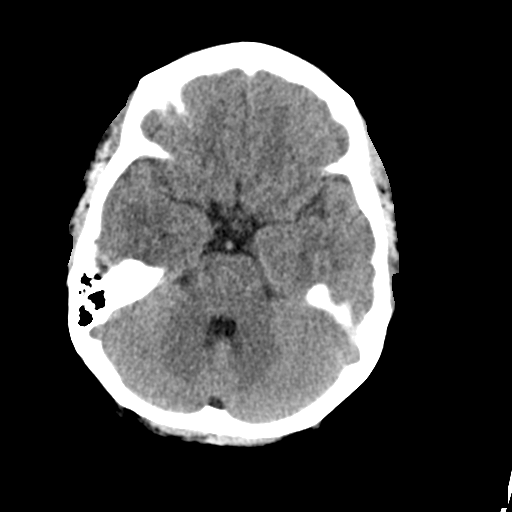
[im 9/30  bone]
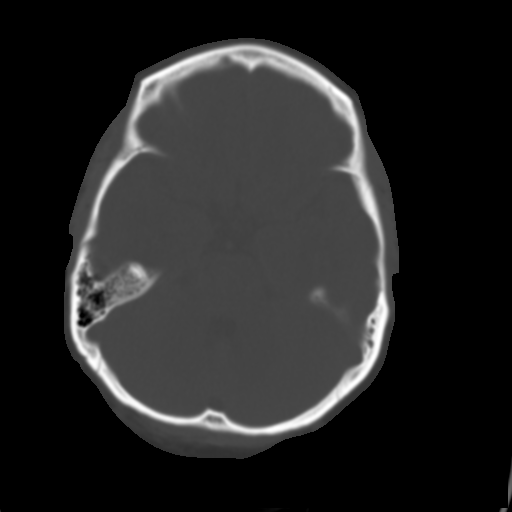
[im 11/30  brain]
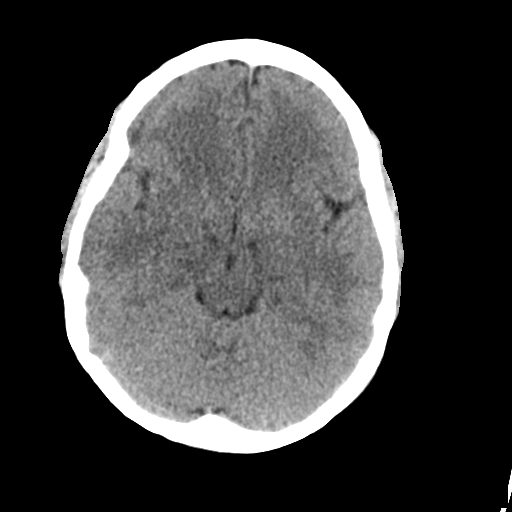
[im 13/30  brain]
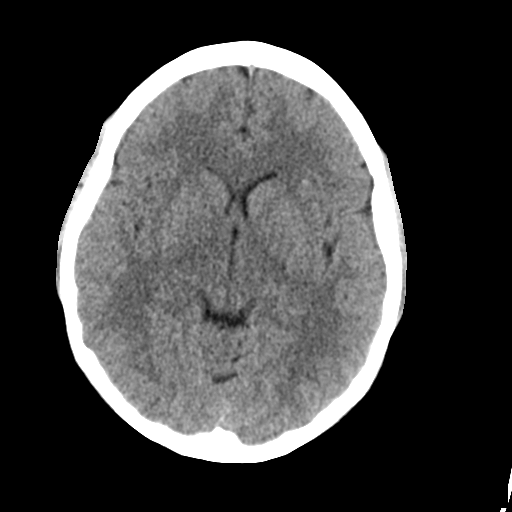
[im 15/30  brain]
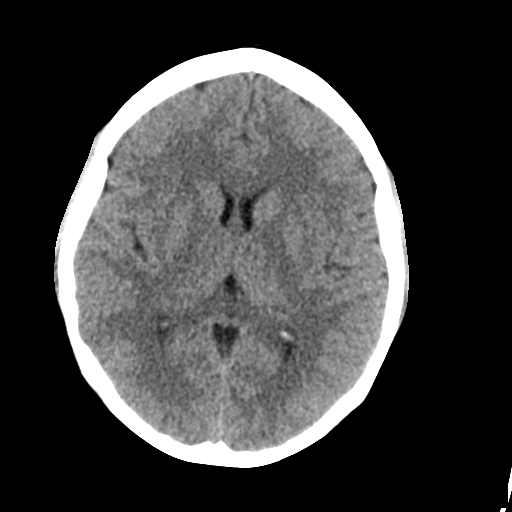
[im 16/30  brain]
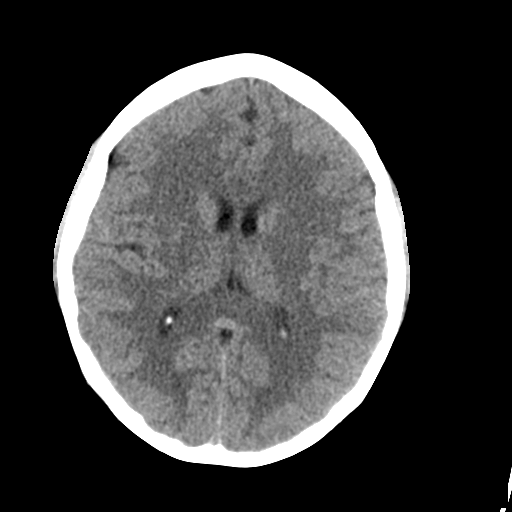
[im 16/30  bone]
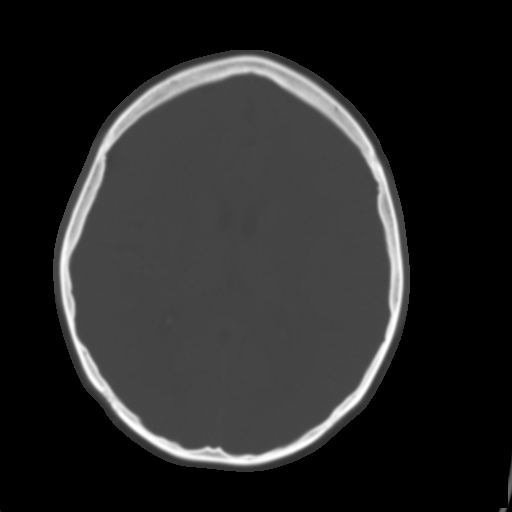
[im 18/30  brain]
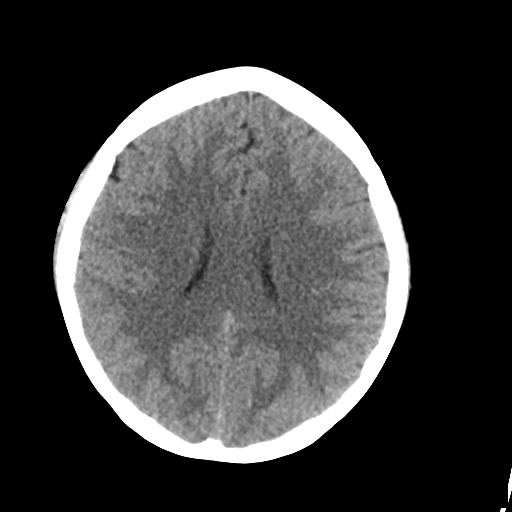
[im 20/30  brain]
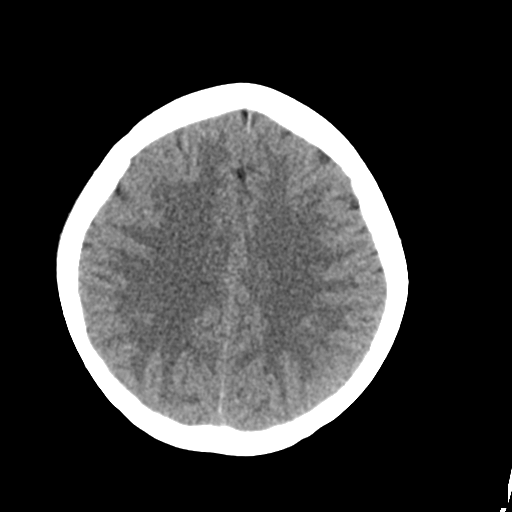
[im 22/30  brain]
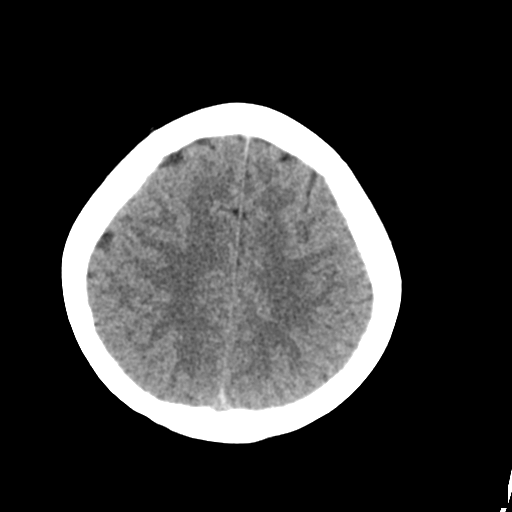
[im 23/30  brain]
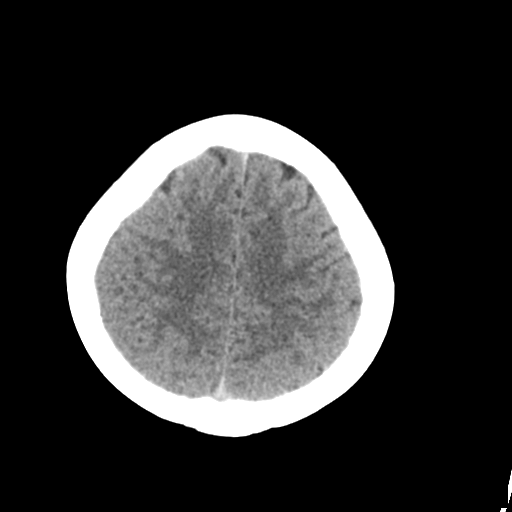
[im 23/30  bone]
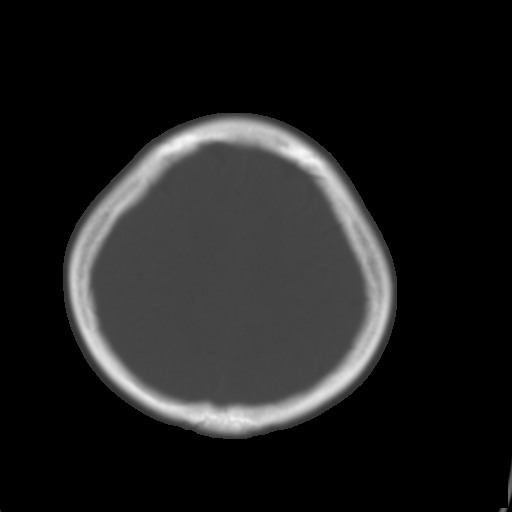
[im 25/30  brain]
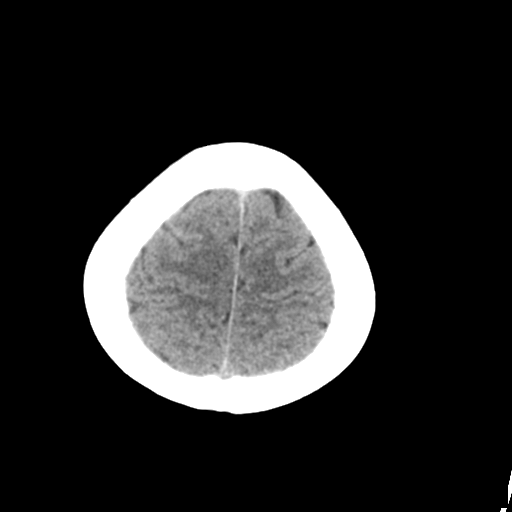
[im 27/30  brain]
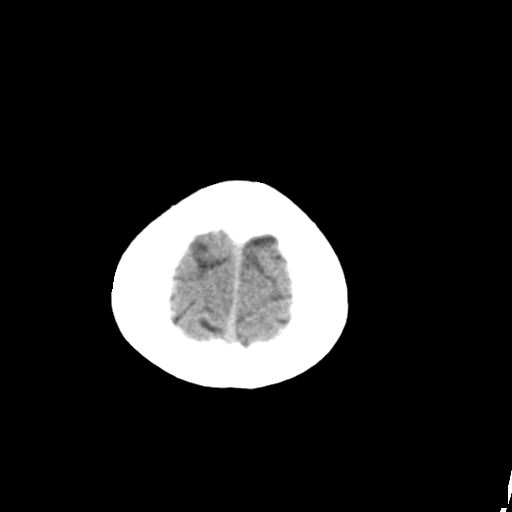
[im 29/30  brain]
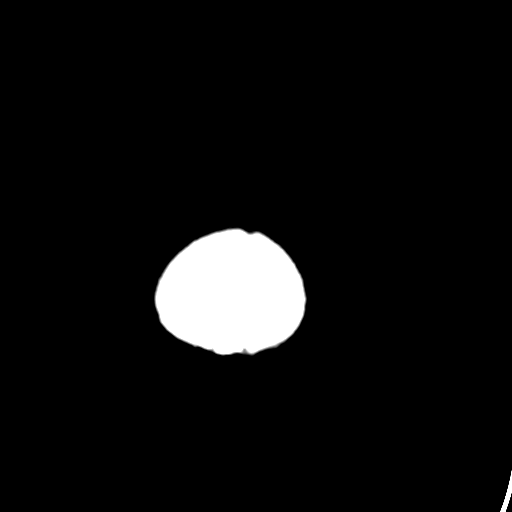

[16 of 30 positions shown; findings below may reference images not displayed]

FINDINGS: Sinuses/Soft tissues: Clear paranasal sinuses and mastoid air cells.

Intracranial: No mass lesion, hemorrhage, hydrocephalus, acute
infarct, intra-axial, or extra-axial fluid collection.
IMPRESSION: Normal head CT.

## 2015-01-11 DIAGNOSIS — K760 Fatty (change of) liver, not elsewhere classified: Secondary | ICD-10-CM

## 2015-01-11 DIAGNOSIS — Z8619 Personal history of other infectious and parasitic diseases: Secondary | ICD-10-CM

## 2015-01-11 HISTORY — DX: Fatty (change of) liver, not elsewhere classified: K76.0

## 2015-01-11 HISTORY — DX: Personal history of other infectious and parasitic diseases: Z86.19

## 2015-03-08 DIAGNOSIS — N200 Calculus of kidney: Secondary | ICD-10-CM

## 2015-03-08 HISTORY — DX: Calculus of kidney: N20.0

## 2018-09-10 ENCOUNTER — Emergency Department (HOSPITAL_COMMUNITY): Payer: Medicaid Other

## 2018-09-10 ENCOUNTER — Other Ambulatory Visit: Payer: Self-pay

## 2018-09-10 ENCOUNTER — Inpatient Hospital Stay (HOSPITAL_COMMUNITY)
Admission: EM | Admit: 2018-09-10 | Discharge: 2018-09-14 | DRG: 871 | Disposition: A | Discharge status: Home / Self Care | Payer: Medicaid Other | Attending: Internal Medicine | Admitting: Internal Medicine

## 2018-09-10 ENCOUNTER — Encounter (HOSPITAL_COMMUNITY): Payer: Self-pay | Admitting: Emergency Medicine

## 2018-09-10 DIAGNOSIS — D509 Iron deficiency anemia, unspecified: Secondary | ICD-10-CM | POA: Diagnosis present

## 2018-09-10 DIAGNOSIS — Z8249 Family history of ischemic heart disease and other diseases of the circulatory system: Secondary | ICD-10-CM

## 2018-09-10 DIAGNOSIS — Z87440 Personal history of urinary (tract) infections: Secondary | ICD-10-CM

## 2018-09-10 DIAGNOSIS — R6521 Severe sepsis with septic shock: Secondary | ICD-10-CM | POA: Diagnosis present

## 2018-09-10 DIAGNOSIS — N390 Urinary tract infection, site not specified: Secondary | ICD-10-CM

## 2018-09-10 DIAGNOSIS — N29 Other disorders of kidney and ureter in diseases classified elsewhere: Secondary | ICD-10-CM | POA: Diagnosis not present

## 2018-09-10 DIAGNOSIS — R509 Fever, unspecified: Secondary | ICD-10-CM | POA: Diagnosis present

## 2018-09-10 DIAGNOSIS — A419 Sepsis, unspecified organism: Principal | ICD-10-CM | POA: Diagnosis present

## 2018-09-10 DIAGNOSIS — Z87442 Personal history of urinary calculi: Secondary | ICD-10-CM | POA: Diagnosis not present

## 2018-09-10 DIAGNOSIS — N12 Tubulo-interstitial nephritis, not specified as acute or chronic: Secondary | ICD-10-CM | POA: Diagnosis present

## 2018-09-10 DIAGNOSIS — R0600 Dyspnea, unspecified: Secondary | ICD-10-CM

## 2018-09-10 LAB — CBC WITH DIFFERENTIAL/PLATELET
Abs Immature Granulocytes: 0.07 10*3/uL (ref 0.00–0.07)
Basophils Absolute: 0 10*3/uL (ref 0.0–0.1)
Basophils Relative: 0 %
EOS ABS: 0 10*3/uL (ref 0.0–0.5)
EOS PCT: 0 %
HEMATOCRIT: 34.5 % — AB (ref 36.0–46.0)
HEMOGLOBIN: 10.6 g/dL — AB (ref 12.0–15.0)
Immature Granulocytes: 0 %
LYMPHS PCT: 9 %
Lymphs Abs: 1.5 10*3/uL (ref 0.7–4.0)
MCH: 23.8 pg — ABNORMAL LOW (ref 26.0–34.0)
MCHC: 30.7 g/dL (ref 30.0–36.0)
MCV: 77.4 fL — ABNORMAL LOW (ref 80.0–100.0)
Monocytes Absolute: 1.3 10*3/uL — ABNORMAL HIGH (ref 0.1–1.0)
Monocytes Relative: 7 %
Neutro Abs: 13.9 10*3/uL — ABNORMAL HIGH (ref 1.7–7.7)
Neutrophils Relative %: 84 %
Platelets: 332 10*3/uL (ref 150–400)
RBC: 4.46 MIL/uL (ref 3.87–5.11)
RDW: 16.3 % — AB (ref 11.5–15.5)
WBC: 16.8 10*3/uL — ABNORMAL HIGH (ref 4.0–10.5)
nRBC: 0 % (ref 0.0–0.2)

## 2018-09-10 LAB — COMPREHENSIVE METABOLIC PANEL
ALBUMIN: 3.8 g/dL (ref 3.5–5.0)
ALT: 22 U/L (ref 0–44)
AST: 24 U/L (ref 15–41)
Alkaline Phosphatase: 88 U/L (ref 38–126)
Anion gap: 10 (ref 5–15)
BILIRUBIN TOTAL: 0.7 mg/dL (ref 0.3–1.2)
BUN: 10 mg/dL (ref 6–20)
CO2: 22 mmol/L (ref 22–32)
Calcium: 9.2 mg/dL (ref 8.9–10.3)
Chloride: 104 mmol/L (ref 98–111)
Creatinine, Ser: 0.89 mg/dL (ref 0.44–1.00)
GFR calc Af Amer: >60 mL/min (ref >60)
GFR calc non Af Amer: >60 mL/min (ref >60)
GLUCOSE: 124 mg/dL — AB (ref 70–99)
Potassium: 3.4 mmol/L — ABNORMAL LOW (ref 3.5–5.1)
Sodium: 136 mmol/L (ref 135–145)
Total Protein: 7.7 g/dL (ref 6.5–8.1)

## 2018-09-10 LAB — I-STAT BETA HCG BLOOD, ED (MC, WL, AP ONLY): I-stat hCG, quantitative: <5 m[IU]/mL (ref <5)

## 2018-09-10 LAB — PROTIME-INR
INR: 1.14
Prothrombin Time: 14.5 seconds (ref 11.4–15.2)

## 2018-09-10 LAB — I-STAT CG4 LACTIC ACID, ED: Lactic Acid, Venous: 1.35 mmol/L (ref 0.5–1.9)

## 2018-09-10 MED ORDER — SODIUM CHLORIDE 0.9 % IV SOLN
1.0000 g | Freq: Once | INTRAVENOUS | Status: AC
  Administered 2018-09-10: 1 g via INTRAVENOUS
  Filled 2018-09-10: qty 10

## 2018-09-10 MED ORDER — ACETAMINOPHEN 325 MG PO TABS
650.0000 mg | ORAL_TABLET | Freq: Once | ORAL | Status: AC | PRN
  Administered 2018-09-10: 650 mg via ORAL
  Filled 2018-09-10: qty 2

## 2018-09-10 MED ORDER — MORPHINE SULFATE (PF) 4 MG/ML IV SOLN
4.0000 mg | Freq: Once | INTRAVENOUS | Status: AC
  Administered 2018-09-10: 4 mg via INTRAVENOUS
  Filled 2018-09-10: qty 1

## 2018-09-10 MED ORDER — SODIUM CHLORIDE 0.9 % IV BOLUS
1000.0000 mL | Freq: Once | INTRAVENOUS | Status: AC
  Administered 2018-09-10: 1000 mL via INTRAVENOUS

## 2018-09-10 MED ORDER — SODIUM CHLORIDE 0.9% FLUSH
3.0000 mL | Freq: Once | INTRAVENOUS | Status: AC
  Administered 2018-09-10: 3 mL via INTRAVENOUS

## 2018-09-10 NOTE — ED Triage Notes (Signed)
Pt c/o fevers, lower back pain and LLQ pain since Monday. Also reports pain with urination and frequency.

## 2018-09-10 NOTE — ED Provider Notes (Signed)
[facility] EMERGENCY DEPARTMENT Provider Note   CSN: [id number] Arrival date & time: 09/10/18  2119     History   Chief Complaint Chief Complaint  Patient presents with  . Fever  . Abdominal Pain    HPI Jacqueline Watkins is a 33 y.o. female sent to the emergency department with a chief complaint of fever.  The patient endorses fever x3 days.  With bilateral low back pain and lower abdominal pain, dysuria, and urinary frequency for the last 2 days.  She also reports that tonight she developed a headache, dry cough, and has been having body aches.  No known sick contacts.  She has been treating her fever with Tylenol (350mg  x3, last dose 3 PM) and 800 mg of Advil (last dose 8:00 PM), but reports that her fever has been returning for the next dose.  She reports that the bilateral low back pain has been constant, but when the pain becomes more intense it radiates around to her bilateral lower abdomen.  She characterizes the pain as throbbing.  Pain is worse with movement.  No known alleviating factors.  She denies shortness of breath, chest pain, visual changes, numbness, weakness, vaginal pain, bleeding, or discharge, nausea, vomiting, diarrhea, rash.  States she was diagnosed with "kidney stones" 3 years ago after having recurrent UTIs.. Per chart review, she was seen by nephrology at [facility]. Per chart review, 24 urine citrate was 56; sodium was 242, and K was 53.  Reports that she was told that she needed to have a follow-up study done, but was unable to afford it and has not been seen by nephrology since.  No known sick contacts.  The history is provided by the patient. A language interpreter was used ([place]).    History reviewed. No pertinent past medical history.  Patient Active Problem List   Diagnosis Date Noted  . Sepsis due to urinary tract infection ([place]) 09/11/2018  . Microcytic anemia 09/11/2018  . 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  Pyelonephritis     Past Surgical History:  Procedure Laterality Date  . CESAREAN SECTION    . NO PAST SURGERIES       OB History    Gravida  3   Para  3   Term  3   Preterm  0   AB  0   Living  3     SAB  0   TAB  0   Ectopic  0   Multiple  0   Live Births  1            Home Medications    Prior to Admission medications   Medication Sig Start Date End Date Taking? Authorizing Provider  clindamycin (CLEOCIN) 300 MG capsule Take 1 capsule (300 mg total) by mouth 4 (four) times daily. X 7 days 07/31/14   Etta Quill, NP  ibuprofen (ADVIL,MOTRIN) 200 MG tablet Take 200 mg by mouth every 6 (six) hours as needed for pain.    [provider]  metoCLOPramide (REGLAN) 10 MG tablet Take 1 tablet (10 mg total) by mouth every 6 (six) hours. 05/23/13   Drenda Freeze, MD    Family History Family History  Problem Relation Age of Onset  . Hypertension Mother     Social History Social History   Tobacco Use  . Smoking status: Never Smoker  . Smokeless tobacco: Never Used  Substance Use Topics  . Alcohol use: No  . Drug use: No  Allergies   Patient has no known allergies.   Review of Systems Review of Systems  Constitutional: Positive for chills and fever. Negative for activity change.  HENT: Positive for sore throat.   Respiratory: Positive for cough. Negative for shortness of breath.   Cardiovascular: Negative for chest pain.  Gastrointestinal: Positive for abdominal pain. Negative for blood in stool, constipation, diarrhea, nausea and vomiting.  Genitourinary: Positive for dysuria and frequency. Negative for hematuria, urgency, vaginal bleeding, vaginal discharge and vaginal pain.  Musculoskeletal: Positive for back pain and myalgias. Negative for arthralgias and neck stiffness.  Skin: Negative for rash.  Allergic/Immunologic: Negative for immunocompromised state.  Neurological: Positive for headaches. Negative for numbness.    Psychiatric/Behavioral: Negative for confusion.     Physical Exam Updated Vital Signs BP (!) 90/59 (BP Location: Left Arm)   Pulse 100   Temp 98.2 F (36.8 C) (Oral)   Resp (!) 24   Ht 5\' 1"  (1.549 m)   Wt 73.1 kg   LMP 08/27/2018   SpO2 100%   BMI 30.45 kg/m   Physical Exam Vitals signs and nursing note reviewed.  Constitutional:      General: She is not in acute distress.    Comments: Appears uncomfortable  HENT:     Head: Normocephalic.  Eyes:     Conjunctiva/sclera: Conjunctivae normal.  Neck:     Musculoskeletal: Neck supple.  Cardiovascular:     Rate and Rhythm: Regular rhythm. Tachycardia present.     Heart sounds: No murmur. No friction rub. No gallop.   Pulmonary:     Effort: Pulmonary effort is normal. No respiratory distress.     Breath sounds: No stridor. No wheezing, rhonchi or rales.  Chest:     Chest wall: No tenderness.  Abdominal:     General: Bowel sounds are normal. There is no distension.     Palpations: Abdomen is soft.     Tenderness: There is abdominal tenderness in the suprapubic area and left lower quadrant. There is right CVA tenderness and left CVA tenderness. There is no guarding or rebound. Negative signs include Murphy's sign, Rovsing's sign, psoas sign and obturator sign.     Hernia: No hernia is present.  Skin:    General: Skin is warm.     Findings: No rash.  Neurological:     Mental Status: She is alert.  Psychiatric:        Behavior: Behavior normal.     ED Treatments / Results  Labs (all labs ordered are listed, but only abnormal results are displayed) Labs Reviewed  COMPREHENSIVE METABOLIC PANEL - Abnormal; Notable for the following components:      Result Value   Potassium 3.4 (*)    Glucose, Bld 124 (*)    All other components within normal limits  CBC WITH DIFFERENTIAL/PLATELET - Abnormal; Notable for the following components:   WBC 16.8 (*)    Hemoglobin 10.6 (*)    HCT 34.5 (*)    MCV 77.4 (*)    MCH 23.8  (*)    RDW 16.3 (*)    Neutro Abs 13.9 (*)    Monocytes Absolute 1.3 (*)    All other components within normal limits  URINALYSIS, ROUTINE W REFLEX MICROSCOPIC - Abnormal; Notable for the following components:   Hgb urine dipstick LARGE (*)    Leukocytes, UA LARGE (*)    Bacteria, UA MANY (*)    All other components within normal limits  BASIC METABOLIC PANEL - Abnormal; Notable  for the following components:   Potassium 3.4 (*)    CO2 20 (*)    Glucose, Bld 100 (*)    Calcium 8.4 (*)    All other components within normal limits  CBC WITH DIFFERENTIAL/PLATELET - Abnormal; Notable for the following components:   WBC 19.6 (*)    Hemoglobin 9.6 (*)    HCT 31.1 (*)    MCV 77.9 (*)    MCH 24.1 (*)    RDW 16.5 (*)    Neutro Abs 16.2 (*)    Monocytes Absolute 1.6 (*)    Abs Immature Granulocytes 0.12 (*)    All other components within normal limits  CULTURE, BLOOD (ROUTINE X 2)  CULTURE, BLOOD (ROUTINE X 2)  URINE CULTURE  PROTIME-INR  INFLUENZA PANEL BY PCR (TYPE A & B)  LACTIC ACID, PLASMA  HIV ANTIBODY (ROUTINE TESTING W REFLEX)  I-STAT CG4 LACTIC ACID, ED  I-STAT BETA HCG BLOOD, ED ([facility], [facility], [facility] ONLY)    EKG EKG Interpretation  Date/Time:  Wednesday September 10 2018 21:31:30 EST Ventricular Rate:  153 PR Interval:  126 QRS Duration: 70 QT Interval:  256 QTC Calculation: 408 R Axis:   103 Text Interpretation:  Sinus tachycardia Rightward axis Borderline ECG tachycardia new from previous Confirmed by Theotis Burrow [phone]) on 09/10/2018 11:06:49 PM   Radiology Dg Chest Port 1 View  Result Date: 09/11/2018 CLINICAL DATA:  Sepsis EXAM: PORTABLE CHEST 1 VIEW COMPARISON:  05/23/2013 FINDINGS: Shallow inspiration. Linear atelectasis in the lung bases. Normal heart size and pulmonary vascularity. No focal airspace disease or consolidation in the lungs. No blunting of costophrenic angles. No pneumothorax. Mediastinal contours appear intact. IMPRESSION: Shallow inspiration with  linear atelectasis in the lung bases. Electronically Signed   By: Lucienne Capers M.D.   On: 09/11/2018 03:14   Ct Renal Stone Study  Result Date: 09/10/2018 CLINICAL DATA:  Left-sided flank pain and polyuria EXAM: CT ABDOMEN AND PELVIS WITHOUT CONTRAST TECHNIQUE: Multidetector CT imaging of the abdomen and pelvis was performed following the standard protocol without IV contrast. COMPARISON:  None. FINDINGS: LOWER CHEST: There is no basilar pleural or apical pericardial effusion. HEPATOBILIARY: The hepatic contours and density are normal. There is no intra- or extrahepatic biliary dilatation. The gallbladder is normal. PANCREAS: The pancreatic parenchymal contours are normal and there is no ductal dilatation. There is no peripancreatic fluid collection. SPLEEN: Normal. ADRENALS/URINARY TRACT: --Adrenal glands: Normal. --Kidneys: There is bilateral medullary nephrocalcinosis. No hydronephrosis or perinephric stranding. Ureters are nondilated. --Urinary bladder: Normal for degree of distention STOMACH/BOWEL: --Stomach/Duodenum: There is no hiatal hernia or other gastric abnormality. The duodenal course and caliber are normal. --Small bowel: No dilatation or inflammation. --Colon: No focal abnormality. --Appendix: Normal. VASCULAR/LYMPHATIC: Normal course and caliber of the major abdominal vessels. No abdominal or pelvic lymphadenopathy. REPRODUCTIVE: Normal uterus and ovaries. MUSCULOSKELETAL. No bony spinal canal stenosis or focal osseous abnormality. OTHER: None. IMPRESSION: 1. Bilateral medullary nephrocalcinosis, which may be secondary to hyperparathyroidism, renal tubular acidosis, medullary sponge kidney or hypercalcemia. 2. No hydronephrosis or ureteral obstruction. Electronically Signed   By: Ulyses Jarred M.D.   On: 09/10/2018 23:51    Procedures .Critical Care Performed by: Joanne Gavel, PA-C Authorized by: Joanne Gavel, PA-C   Critical care provider statement:    Critical care time  (minutes):  45   Critical care time was exclusive of:  Separately billable procedures and treating other patients and teaching time   Critical care was time spent personally by me  on the following activities:  Obtaining history from patient or surrogate, examination of patient, evaluation of patient's response to treatment, development of treatment plan with patient or surrogate, ordering and performing treatments and interventions, ordering and review of laboratory studies, ordering and review of radiographic studies, pulse oximetry, re-evaluation of patient's condition and review of old charts   I assumed direction of critical care for this patient from another provider in my specialty: no     (including critical care time)  Medications Ordered in ED Medications  enoxaparin (LOVENOX) injection 40 mg (has no administration in time range)  sodium chloride flush (NS) 0.9 % injection 3 mL (3 mLs Intravenous Not Given 09/11/18 0332)  0.9 % NaCl with KCl 20 mEq/ L  infusion ( Intravenous New Bag/Given 09/11/18 0450)  acetaminophen (TYLENOL) tablet 650 mg (has no administration in time range)    Or  acetaminophen (TYLENOL) suppository 650 mg (has no administration in time range)  HYDROcodone-acetaminophen (NORCO/VICODIN) 5-325 MG per tablet 1-2 tablet (has no administration in time range)  polyethylene glycol (MIRALAX / GLYCOLAX) packet 17 g (has no administration in time range)  ondansetron (ZOFRAN) tablet 4 mg (has no administration in time range)    Or  ondansetron (ZOFRAN) injection 4 mg (has no administration in time range)  cefTRIAXone (ROCEPHIN) 1 g in sodium chloride 0.9 % 100 mL IVPB (has no administration in time range)  sodium chloride flush (NS) 0.9 % injection 3 mL (3 mLs Intravenous Given 09/10/18 2157)  acetaminophen (TYLENOL) tablet 650 mg (650 mg Oral Given 09/10/18 2139)  sodium chloride 0.9 % bolus 1,000 mL (0 mLs Intravenous Stopped 09/11/18 0026)  morphine 4 MG/ML injection 4 mg (4  mg Intravenous Given 09/10/18 2256)  cefTRIAXone (ROCEPHIN) 1 g in sodium chloride 0.9 % 100 mL IVPB (0 g Intravenous Stopped 09/11/18 0008)  sodium chloride 0.9 % bolus 2,040 mL (0 mL/kg  68 kg Intravenous Stopped 09/11/18 0304)     Initial Impression / Assessment and Plan / ED Course  I have reviewed the triage vital signs and the nursing notes.  Pertinent labs & imaging results that were available during my care of the patient were reviewed by me and considered in my medical decision making (see chart for details).     33 year old female presenting with fever and urinary symptoms for 3 days.  She has had persistent fever despite appropriate administration of antipyretics.  She reports that she had a history of kidney stones so a CT stone study was ordered.  On arrival, the patient was tachycardic in the 160s and febrile to 103.  Blood pressure was 113/83 and respirations were 19 with an SaO2 of 100%.  The patient's blood pressure continued to downtrend into the 80-90s/50-60s with increasing tachypnea (MAP 54-60s).  Tachycardia finally improved to the 100s.  Urine is consistent with infection.  Urine culture sent.  Blood cultures x2 are pending.  Labs are notable for leukocytosis of 16.8 and elevated neutrophil count of 13.9.  Hemoglobin is 10.6.  Code sepsis initiated with urinary source.  She was given an one liter IV fluid bolus followed by 30 mL/kg bolus for continued hypotension.  Previous urine cultures reviewed and the patient grew pansensitive E. coli and beta-hemolytic strep.  Rocephin initiated in the ED.  She was also tested for influenza and found to be negative.  CT renal stone study with bilateral medullary nephrocalcinosis, which may be secondary to hyperparathyroidism, renal tubular acidosis, medullary sponge kidney, or hypercalcemia.  There is  no hydronephrosis or ureteral obstruction.  The patient is in septic shock secondary to urinary source.  The hospitalist was consulted and  Dr. opened has accepted the patient for admission.  The patient appears reasonably stabilized for admission considering the current resources, flow, and capabilities available in the ED at this time, and I doubt any other [facility] requiring further screening and/or treatment in the ED prior to admission.   Final Clinical Impressions(s) / ED Diagnoses   Final diagnoses:  Septic shock [facility])  Pyelonephritis  Nephrocalcinosis    ED Discharge Orders    None       Joanne Gavel, PA-C 09/11/18 [place], Wenda Overland, MD 09/12/18 1646

## 2018-09-10 NOTE — ED Notes (Addendum)
Pt currently using bedpan

## 2018-09-11 ENCOUNTER — Encounter (HOSPITAL_COMMUNITY): Payer: Self-pay | Admitting: Family Medicine

## 2018-09-11 ENCOUNTER — Observation Stay (HOSPITAL_COMMUNITY): Payer: Medicaid Other

## 2018-09-11 DIAGNOSIS — N390 Urinary tract infection, site not specified: Secondary | ICD-10-CM

## 2018-09-11 DIAGNOSIS — N12 Tubulo-interstitial nephritis, not specified as acute or chronic: Secondary | ICD-10-CM | POA: Insufficient documentation

## 2018-09-11 DIAGNOSIS — D509 Iron deficiency anemia, unspecified: Secondary | ICD-10-CM

## 2018-09-11 DIAGNOSIS — A419 Sepsis, unspecified organism: Secondary | ICD-10-CM | POA: Diagnosis present

## 2018-09-11 DIAGNOSIS — N29 Other disorders of kidney and ureter in diseases classified elsewhere: Secondary | ICD-10-CM

## 2018-09-11 HISTORY — DX: Other disorders of calcium metabolism: E83.59

## 2018-09-11 HISTORY — DX: Sepsis, unspecified organism: A41.9

## 2018-09-11 HISTORY — DX: Iron deficiency anemia, unspecified: D50.9

## 2018-09-11 HISTORY — DX: Other disorders of kidney and ureter in diseases classified elsewhere: N29

## 2018-09-11 HISTORY — DX: Urinary tract infection, site not specified: N39.0

## 2018-09-11 LAB — URINALYSIS, ROUTINE W REFLEX MICROSCOPIC
BILIRUBIN URINE: NEGATIVE
Glucose, UA: NEGATIVE mg/dL
KETONES UR: NEGATIVE mg/dL
Nitrite: NEGATIVE
Protein, ur: NEGATIVE mg/dL
Specific Gravity, Urine: 1.008 (ref 1.005–1.030)
pH: 7 (ref 5.0–8.0)

## 2018-09-11 LAB — BASIC METABOLIC PANEL
Anion gap: 9 (ref 5–15)
BUN: 8 mg/dL (ref 6–20)
CO2: 20 mmol/L — ABNORMAL LOW (ref 22–32)
Calcium: 8.4 mg/dL — ABNORMAL LOW (ref 8.9–10.3)
Chloride: 109 mmol/L (ref 98–111)
Creatinine, Ser: 0.87 mg/dL (ref 0.44–1.00)
GFR calc Af Amer: >60 mL/min (ref >60)
GFR calc non Af Amer: >60 mL/min (ref >60)
Glucose, Bld: 100 mg/dL — ABNORMAL HIGH (ref 70–99)
Potassium: 3.4 mmol/L — ABNORMAL LOW (ref 3.5–5.1)
Sodium: 138 mmol/L (ref 135–145)

## 2018-09-11 LAB — CBC WITH DIFFERENTIAL/PLATELET
Abs Immature Granulocytes: 0.12 10*3/uL — ABNORMAL HIGH (ref 0.00–0.07)
BASOS ABS: 0 10*3/uL (ref 0.0–0.1)
Basophils Relative: 0 %
Eosinophils Absolute: 0 10*3/uL (ref 0.0–0.5)
Eosinophils Relative: 0 %
HCT: 31.1 % — ABNORMAL LOW (ref 36.0–46.0)
Hemoglobin: 9.6 g/dL — ABNORMAL LOW (ref 12.0–15.0)
IMMATURE GRANULOCYTES: 1 %
Lymphocytes Relative: 9 %
Lymphs Abs: 1.7 10*3/uL (ref 0.7–4.0)
MCH: 24.1 pg — ABNORMAL LOW (ref 26.0–34.0)
MCHC: 30.9 g/dL (ref 30.0–36.0)
MCV: 77.9 fL — ABNORMAL LOW (ref 80.0–100.0)
Monocytes Absolute: 1.6 10*3/uL — ABNORMAL HIGH (ref 0.1–1.0)
Monocytes Relative: 8 %
NRBC: 0 % (ref 0.0–0.2)
Neutro Abs: 16.2 10*3/uL — ABNORMAL HIGH (ref 1.7–7.7)
Neutrophils Relative %: 82 %
Platelets: 264 10*3/uL (ref 150–400)
RBC: 3.99 MIL/uL (ref 3.87–5.11)
RDW: 16.5 % — ABNORMAL HIGH (ref 11.5–15.5)
WBC: 19.6 10*3/uL — AB (ref 4.0–10.5)

## 2018-09-11 LAB — HIV ANTIBODY (ROUTINE TESTING W REFLEX): HIV Screen 4th Generation wRfx: NONREACTIVE

## 2018-09-11 LAB — LACTIC ACID, PLASMA: LACTIC ACID, VENOUS: 0.7 mmol/L (ref 0.5–1.9)

## 2018-09-11 LAB — INFLUENZA PANEL BY PCR (TYPE A & B)
Influenza A By PCR: NEGATIVE
Influenza B By PCR: NEGATIVE

## 2018-09-11 MED ORDER — POTASSIUM CHLORIDE IN NACL 20-0.9 MEQ/L-% IV SOLN
INTRAVENOUS | Status: DC
  Administered 2018-09-11 (×2): via INTRAVENOUS
  Filled 2018-09-11: qty 1000

## 2018-09-11 MED ORDER — POTASSIUM CHLORIDE IN NACL 20-0.9 MEQ/L-% IV SOLN
INTRAVENOUS | Status: AC
  Administered 2018-09-11: 05:00:00 via INTRAVENOUS
  Filled 2018-09-11 (×3): qty 1000

## 2018-09-11 MED ORDER — SODIUM CHLORIDE 0.9 % IV BOLUS
30.0000 mL/kg | Freq: Once | INTRAVENOUS | Status: AC
  Administered 2018-09-11: 2040 mL via INTRAVENOUS

## 2018-09-11 MED ORDER — ACETAMINOPHEN 650 MG RE SUPP: 650.0000 mg | Freq: Four times a day (QID) | RECTAL | Status: DC | PRN

## 2018-09-11 MED ORDER — SODIUM CHLORIDE 0.9 % IV SOLN
2.0000 g | INTRAVENOUS | Status: DC
  Administered 2018-09-11 – 2018-09-13 (×3): 2 g via INTRAVENOUS
  Filled 2018-09-11 (×3): qty 20

## 2018-09-11 MED ORDER — SODIUM CHLORIDE 0.9% FLUSH
3.0000 mL | Freq: Two times a day (BID) | INTRAVENOUS | Status: DC
  Administered 2018-09-12: 3 mL via INTRAVENOUS

## 2018-09-11 MED ORDER — ACETAMINOPHEN 325 MG PO TABS: 650.0000 mg | ORAL_TABLET | Freq: Four times a day (QID) | ORAL | Status: DC | PRN

## 2018-09-11 MED ORDER — HYDROCODONE-ACETAMINOPHEN 5-325 MG PO TABS
1.0000 | ORAL_TABLET | ORAL | Status: DC | PRN
  Administered 2018-09-11 – 2018-09-12 (×5): 2 via ORAL
  Filled 2018-09-11 (×5): qty 2

## 2018-09-11 MED ORDER — POLYETHYLENE GLYCOL 3350 17 G PO PACK: 17.0000 g | PACK | Freq: Every day | ORAL | Status: DC | PRN

## 2018-09-11 MED ORDER — ONDANSETRON HCL 4 MG/2ML IJ SOLN: 4.0000 mg | Freq: Four times a day (QID) | INTRAMUSCULAR | Status: DC | PRN

## 2018-09-11 MED ORDER — METOPROLOL TARTRATE 12.5 MG HALF TABLET
12.5000 mg | ORAL_TABLET | Freq: Two times a day (BID) | ORAL | Status: DC
  Administered 2018-09-11 – 2018-09-13 (×4): 12.5 mg via ORAL
  Filled 2018-09-11 (×4): qty 1

## 2018-09-11 MED ORDER — METOPROLOL TARTRATE 5 MG/5ML IV SOLN
5.0000 mg | Freq: Once | INTRAVENOUS | Status: AC
  Administered 2018-09-11: 5 mg via INTRAVENOUS

## 2018-09-11 MED ORDER — SODIUM CHLORIDE 0.9 % IV SOLN
1.0000 g | INTRAVENOUS | Status: DC
  Filled 2018-09-11: qty 10

## 2018-09-11 MED ORDER — ENOXAPARIN SODIUM 40 MG/0.4ML ~~LOC~~ SOLN
40.0000 mg | SUBCUTANEOUS | Status: DC
  Administered 2018-09-11 – 2018-09-14 (×4): 40 mg via SUBCUTANEOUS
  Filled 2018-09-11 (×4): qty 0.4

## 2018-09-11 MED ORDER — METOPROLOL TARTRATE 5 MG/5ML IV SOLN
INTRAVENOUS | Status: AC
  Filled 2018-09-11: qty 5

## 2018-09-11 MED ORDER — ONDANSETRON HCL 4 MG PO TABS: 4.0000 mg | ORAL_TABLET | Freq: Four times a day (QID) | ORAL | Status: DC | PRN

## 2018-09-11 NOTE — Progress Notes (Signed)
Report received from ED RN [place] .

## 2018-09-11 NOTE — ED Notes (Signed)
Pt alert and oriented, denies dizziness or lightheadedness

## 2018-09-11 NOTE — H&P (Signed)
History and Physical    Jacqueline Watkins [id number]:[id number] DOB: 1986/07/05 DOA: 09/10/2018  PCP: Patient, No Pcp Per   Patient coming from: Home  Chief Complaint: Fever, left flank pain, dysuria  HPI: Jacqueline Watkins is a 33 y.o. female with medical history significant for nephrocalcinosis and recurrent UTIs, now presenting to the emergency department for evaluation of fever, left flank pain, dysuria, and urinary frequency.  Patient has had recurrent UTIs since 2016, most recently she was treated with an unknown antibiotic approximately 3 months ago with complete resolution at that time.  Symptoms returned on 09/08/2018, initially with dysuria and left flank pain, and then fever and palpitations.  Symptoms have progressively worsened.  She had been following with nephrology at [facility] for her nephrocalcinosis, but was lost to follow-up in late 2016.  She reports developing some palpitations while in the ED, but denies any chest pain or shortness of breath.  Denies headache or lightheadedness.  Denies any vomiting, diarrhea, melena, or hematochezia.  ED Course: Upon arrival to the ED, patient is found to be febrile to 39.4 C, saturating well on room air, tachypneic, tachycardic to 150s, and with blood pressure 76/45.  EKG features sinus tachycardia with rate 153.  CT abdomen and pelvis is negative for hydronephrosis or ureteral obstruction, but notable for chronic bilateral medullary nephrocalcinosis.  Chemistry panel features a slight hypokalemia, CBC is notable for leukocytosis to 16,800 with chronic microcytic anemia.  Influenza PCR is negative.  Lactic acid was normal, INR normal, and urinalysis suggestive of infection.  Blood cultures were collected and the patient was given 1 L of normal saline, acetaminophen, and Rocephin in the ED.  Blood pressure remained low with MAP in 50s to low 60s and she was then given 2 additional liters of normal saline and urine culture was added on.  Heart  rate is now in the low 100s and MAP in mid 60s to low 70s.  She will be admitted to the stepdown unit for ongoing evaluation and management of sepsis secondary to UTI.  Review of Systems:  All other systems reviewed and apart from HPI, are negative.  History reviewed. No pertinent past medical history.  Past Surgical History:  Procedure Laterality Date  . CESAREAN SECTION    . NO PAST SURGERIES       reports that she has never smoked. She has never used smokeless tobacco. She reports that she does not drink alcohol or use drugs.  No Known Allergies  Family History  Problem Relation Age of Onset  . Hypertension Mother      Prior to Admission medications   Medication Sig Start Date End Date Taking? Authorizing Provider  clindamycin (CLEOCIN) 300 MG capsule Take 1 capsule (300 mg total) by mouth 4 (four) times daily. X 7 days 07/31/14   Etta Quill, NP  ibuprofen (ADVIL,MOTRIN) 200 MG tablet Take 200 mg by mouth every 6 (six) hours as needed for pain.    [provider]  metoCLOPramide (REGLAN) 10 MG tablet Take 1 tablet (10 mg total) by mouth every 6 (six) hours. 05/23/13   Drenda Freeze, MD    Physical Exam: Vitals:   09/11/18 0115 09/11/18 0145 09/11/18 0200 09/11/18 0215  BP: (!) 95/57 (!) 93/59 90/62 (!) 84/53  Pulse: (!) 108 (!) 107 (!) 108 (!) 106  Resp: (!) 26 (!) 27 (!) 28 (!) 25  Temp:      TempSrc:      SpO2: 98% 96% 100% 100%  Weight:      Height:        Constitutional: NAD, calm  Eyes: PERTLA, lids and conjunctivae normal ENMT: Mucous membranes are moist. Posterior pharynx clear of any exudate or lesions.   Neck: normal, supple, no masses, no thyromegaly Respiratory: clear to auscultation bilaterally, no wheezing, no crackles. Normal respiratory effort.    Cardiovascular: Rate ~110 and regular. No extremity edema.   Abdomen: No distension, soft, tender in left flank, no rebound pain or guarding. Bowel sounds active.  Musculoskeletal: no  clubbing / cyanosis. No joint deformity upper and lower extremities.   Skin: no significant rashes, lesions, ulcers. Warm, dry, well-perfused. Neurologic: CN 2-12 grossly intact. Sensation intact. Strength 5/5 in all 4 limbs.  Psychiatric: Alert and oriented x 3. Very pleasant, cooperative.    Labs on Admission: I have personally reviewed following labs and imaging studies  CBC: Recent Labs  Lab 09/10/18 2151  WBC 16.8*  NEUTROABS 13.9*  HGB 10.6*  HCT 34.5*  MCV 77.4*  PLT 094   Basic Metabolic Panel: Recent Labs  Lab 09/10/18 2151  NA 136  K 3.4*  CL 104  CO2 22  GLUCOSE 124*  BUN 10  CREATININE 0.89  CALCIUM 9.2   GFR: Estimated Creatinine Clearance: 80.1 mL/min (by C-G formula based on SCr of 0.89 mg/dL). Liver Function Tests: Recent Labs  Lab 09/10/18 2151  AST 24  ALT 22  ALKPHOS 88  BILITOT 0.7  PROT 7.7  ALBUMIN 3.8   No results for input(s): LIPASE, AMYLASE in the last 168 hours. No results for input(s): AMMONIA in the last 168 hours. Coagulation Profile: Recent Labs  Lab 09/10/18 2151  INR 1.14   Cardiac Enzymes: No results for input(s): CKTOTAL, CKMB, CKMBINDEX, TROPONINI in the last 168 hours. BNP (last 3 results) No results for input(s): PROBNP in the last 8760 hours. HbA1C: No results for input(s): HGBA1C in the last 72 hours. CBG: No results for input(s): GLUCAP in the last 168 hours. Lipid Profile: No results for input(s): CHOL, HDL, LDLCALC, TRIG, CHOLHDL, LDLDIRECT in the last 72 hours. Thyroid Function Tests: No results for input(s): TSH, T4TOTAL, FREET4, T3FREE, THYROIDAB in the last 72 hours. Anemia Panel: No results for input(s): VITAMINB12, FOLATE, FERRITIN, TIBC, IRON, RETICCTPCT in the last 72 hours. Urine analysis:    Component Value Date/Time   COLORURINE YELLOW 09/10/2018 2347   APPEARANCEUR CLEAR 09/10/2018 2347   LABSPEC 1.008 09/10/2018 2347   PHURINE 7.0 09/10/2018 2347   GLUCOSEU NEGATIVE 09/10/2018 2347    HGBUR LARGE (A) 09/10/2018 2347   BILIRUBINUR NEGATIVE 09/10/2018 2347   KETONESUR NEGATIVE 09/10/2018 2347   PROTEINUR NEGATIVE 09/10/2018 2347   UROBILINOGEN 0.2 02/04/2008 1734   NITRITE NEGATIVE 09/10/2018 2347   LEUKOCYTESUR LARGE (A) 09/10/2018 2347   Sepsis Labs: @LABRCNTIP (procalcitonin:4,lacticidven:4) )No results found for this or any previous visit (from the past 240 hour(s)).   Radiological Exams on Admission: Ct Renal Stone Study  Result Date: 09/10/2018 CLINICAL DATA:  Left-sided flank pain and polyuria EXAM: CT ABDOMEN AND PELVIS WITHOUT CONTRAST TECHNIQUE: Multidetector CT imaging of the abdomen and pelvis was performed following the standard protocol without IV contrast. COMPARISON:  None. FINDINGS: LOWER CHEST: There is no basilar pleural or apical pericardial effusion. HEPATOBILIARY: The hepatic contours and density are normal. There is no intra- or extrahepatic biliary dilatation. The gallbladder is normal. PANCREAS: The pancreatic parenchymal contours are normal and there is no ductal dilatation. There is no peripancreatic fluid collection. SPLEEN: Normal. ADRENALS/URINARY TRACT: --  Adrenal glands: Normal. --Kidneys: There is bilateral medullary nephrocalcinosis. No hydronephrosis or perinephric stranding. Ureters are nondilated. --Urinary bladder: Normal for degree of distention STOMACH/BOWEL: --Stomach/Duodenum: There is no hiatal hernia or other gastric abnormality. The duodenal course and caliber are normal. --Small bowel: No dilatation or inflammation. --Colon: No focal abnormality. --Appendix: Normal. VASCULAR/LYMPHATIC: Normal course and caliber of the major abdominal vessels. No abdominal or pelvic lymphadenopathy. REPRODUCTIVE: Normal uterus and ovaries. MUSCULOSKELETAL. No bony spinal canal stenosis or focal osseous abnormality. OTHER: None. IMPRESSION: 1. Bilateral medullary nephrocalcinosis, which may be secondary to hyperparathyroidism, renal tubular acidosis,  medullary sponge kidney or hypercalcemia. 2. No hydronephrosis or ureteral obstruction. Electronically Signed   By: Ulyses Jarred M.D.   On: 09/10/2018 23:51    EKG: Independently reviewed. Sinus tachycardia (rate 153).   Assessment/Plan   1. Sepsis secondary to UTI  - Presents with fevers, left flank pain, dysuria, and urinary frequency, worsening since 09/08/18  - She is found to be febrile, tachycardic, and hypotensive with leukocytosis, normal initial lactate  - CT without hydronephrosis or ureteral obstruction  - She has received 3 liters NS in ED with improvement in HR and BP  - Blood cultures were collected in ED, urine culture ordered, and she was started on empiric Rocephin  - Continue IVF, continue empiric antibiotics, follow cultures and clinical course   2. Chronic nephrocalcinosis  - Noted on imaging in 2016 when she began to have recurrent UTI's  - She was evaluated by nephrology at [facility] with 24 hr urine studies, advised to follow low-salt diet, and was to return for repeat 24-hour urine testing but has not followed-up  - Renal function remains normal; advised resuming nephrology follow-up   3. Microcytic anemia  - Hgb is 10.6 on admission with MCV 77.4  - Chronic, appears stable, no active bleeding, outpatient follow-up recommended     DVT prophylaxis: Lovneox Code Status: Full  Family Communication: Discussed with patient  Consults called: None Admission status: Observation    Vianne Bulls, MD [facility] Pager [phone]  If 7PM-7AM, please contact night-coverage [email or web address] Password [email or web address]  09/11/2018, 2:35 AM

## 2018-09-11 NOTE — Plan of Care (Signed)
Patient is a 33 y.o. female with medical history significant for nephrocalcinosis and recurrent UTIs, now presenting to the emergency department for evaluation of fever, left flank pain, dysuria, and urinary frequency.  Patient has had recurrent UTIs since 2016, most recently she was treated with an unknown antibiotic approximately 3 months ago with complete resolution at that time.  Symptoms returned on 09/08/2018, initially with dysuria and left flank pain, and then fever and palpitations, progressively worsened.  She had been following with nephrology at [facility] for her nephrocalcinosis, but was lost to follow-up in late 2016.   Patient seen and examined.  Vitals, labs, imaging reviewed.  She is still complaining of left lower quadrant abdominal pain/left CVA tenderness.  Still tachycardic.  Per ED report this was sinus tachycardia.  We will continue IV fluids.  Will increase the dose of ceftriaxone to 2 g.  Await urine cultures.  Plan of care discussed with the patient.

## 2018-09-11 NOTE — Progress Notes (Signed)
Pt received from ED, ao x4. Breathing even and unlabored in RA. BP soft. Connected to tele monitor, CCMD notified. Pt only speaks spanish, translation device used for communication. Oriented pt to room and call bell system, instructed to call staff when getting OOB. Pt voiced understanding. Call bell within reach. Will continue to monitor.

## 2018-09-12 LAB — CBC
HEMATOCRIT: 27.6 % — AB (ref 36.0–46.0)
HEMOGLOBIN: 8.2 g/dL — AB (ref 12.0–15.0)
MCH: 23.4 pg — ABNORMAL LOW (ref 26.0–34.0)
MCHC: 29.7 g/dL — ABNORMAL LOW (ref 30.0–36.0)
MCV: 78.9 fL — ABNORMAL LOW (ref 80.0–100.0)
Platelets: 247 10*3/uL (ref 150–400)
RBC: 3.5 MIL/uL — ABNORMAL LOW (ref 3.87–5.11)
RDW: 16.9 % — ABNORMAL HIGH (ref 11.5–15.5)
WBC: 18.6 10*3/uL — ABNORMAL HIGH (ref 4.0–10.5)
nRBC: 0 % (ref 0.0–0.2)

## 2018-09-12 LAB — BASIC METABOLIC PANEL
Anion gap: 6 (ref 5–15)
BUN: 7 mg/dL (ref 6–20)
CO2: 20 mmol/L — AB (ref 22–32)
Calcium: 7.8 mg/dL — ABNORMAL LOW (ref 8.9–10.3)
Chloride: 112 mmol/L — ABNORMAL HIGH (ref 98–111)
Creatinine, Ser: 0.73 mg/dL (ref 0.44–1.00)
GFR calc Af Amer: >60 mL/min (ref >60)
GFR calc non Af Amer: >60 mL/min (ref >60)
GLUCOSE: 100 mg/dL — AB (ref 70–99)
Potassium: 4.7 mmol/L (ref 3.5–5.1)
Sodium: 138 mmol/L (ref 135–145)

## 2018-09-12 LAB — GLUCOSE, CAPILLARY: Glucose-Capillary: 82 mg/dL (ref 70–99)

## 2018-09-12 MED ORDER — SODIUM CHLORIDE 0.45 % IV SOLN
INTRAVENOUS | Status: DC
  Administered 2018-09-12 – 2018-09-13 (×3): via INTRAVENOUS

## 2018-09-12 MED ORDER — HYDROCODONE-ACETAMINOPHEN 5-325 MG PO TABS
1.0000 | ORAL_TABLET | ORAL | Status: DC | PRN
  Administered 2018-09-12 – 2018-09-13 (×2): 2 via ORAL
  Administered 2018-09-13: 1 via ORAL
  Filled 2018-09-12: qty 2
  Filled 2018-09-12: qty 1
  Filled 2018-09-12: qty 2

## 2018-09-12 MED ORDER — ACETAMINOPHEN 325 MG PO TABS
650.0000 mg | ORAL_TABLET | Freq: Four times a day (QID) | ORAL | Status: DC | PRN
  Administered 2018-09-13 – 2018-09-14 (×2): 650 mg via ORAL
  Filled 2018-09-12 (×2): qty 2

## 2018-09-12 MED ORDER — SODIUM CHLORIDE 0.9 % IV SOLN: INTRAVENOUS | Status: DC

## 2018-09-12 NOTE — Progress Notes (Signed)
[place] TEAM 1 - Stepdown/ICU TEAM  Jacqueline Watkins  ZJI:[id number] DOB: 06-Mar-1986 DOA: 09/10/2018 PCP: Patient, No Pcp Per    Brief Narrative:  33 y.o. female with a hx of nephrocalcinosis and recurrent UTIs since 2016 who presented w/ fever, left flank pain, dysuria, and urinary frequency. She had been following with Nephrology at [facility], but was lost to follow-up in late 2016.   In the ED she was found to be febrile to 39.4 C, tachypneic, tachycardic to 150s, and with blood pressure 76/45. CT abdom/pelvis negative for hydronephrosis or ureteral obstruction, but notable for chronic bilateral medullary nephrocalcinosis. CBC was notable for leukocytosis to 16,800 with chronic microcytic anemia. Urinalysis was suggestive of infection.    Subjective: The patient is resting comfortably.  There is no evidence of respiratory distress.  There is no evidence of uncontrolled pain.  There is no family present.  Assessment & Plan:  Sepsis secondary to UTI  CT without hydronephrosis or ureteral obstruction - clinically much improved after aggressive volume resuscitation, but remains somewhat tachycardic - no culture data to direct tx further -continue volume resuscitation and follow clinical indicators of sepsis  Chronic nephrocalcinosis  Noted on imaging in 2016 when she began to have recurrent UTI's - was evaluated by Nephrology at [facility] with 24 hr urine studies, advised to follow low-salt diet, and was to return for repeat 24-hour urine testing but has not followed-up -renal function presently normal  Microcytic anemia  Hgb 10.6 on admission with MCV 77.4 - appears chronic/stable w/ no active bleeding - outpatient follow-up recommended    DVT prophylaxis: lovenox  Code Status: FULL CODE Family Communication: no family present at time of exam  Disposition Plan: Anticipate discharge home 1/18 if tachycardia improved/patient euvolemic disease:  Consultants:  none  Antimicrobials:    Rocephin 1/15 >  Objective: Blood pressure 109/73, pulse (!) 102, temperature 98.8 F (37.1 C), temperature source Oral, resp. rate 18, height 5\' 1"  (1.549 m), weight 73.1 kg, last menstrual period 08/27/2018, SpO2 98 %.  Intake/Output Summary (Last 24 hours) at 09/12/2018 1545 Last data filed at 09/11/2018 1630 Gross per 24 hour  Intake 360 ml  Output -  Net 360 ml   Filed Weights   09/10/18 2314 09/11/18 0418  Weight: 68 kg 73.1 kg    Examination: General: No acute respiratory distress Lungs: Clear to auscultation bilaterally Cardiovascular: Tachycardic but regular without murmur or rub Extremities: No signif edema bilateral lower extremities  CBC: Recent Labs  Lab 09/10/18 2151 09/11/18 0302 09/12/18 0307  WBC 16.8* 19.6* 18.6*  NEUTROABS 13.9* 16.2*  --   HGB 10.6* 9.6* 8.2*  HCT 34.5* 31.1* 27.6*  MCV 77.4* 77.9* 78.9*  PLT 332 264 175   Basic Metabolic Panel: Recent Labs  Lab 09/10/18 2151 09/11/18 0302 09/12/18 0307  NA 136 138 138  K 3.4* 3.4* 4.7  CL 104 109 112*  CO2 22 20* 20*  GLUCOSE 124* 100* 100*  BUN 10 8 7   CREATININE 0.89 0.87 0.73  CALCIUM 9.2 8.4* 7.8*   GFR: Estimated Creatinine Clearance: 92.3 mL/min (by C-G formula based on SCr of 0.73 mg/dL).  Liver Function Tests: Recent Labs  Lab 09/10/18 2151  AST 24  ALT 22  ALKPHOS 88  BILITOT 0.7  PROT 7.7  ALBUMIN 3.8   Coagulation Profile: Recent Labs  Lab 09/10/18 2151  INR 1.14     Recent Results (from the past 240 hour(s))  Culture, blood (Routine x 2)  Status: None (Preliminary result)   Collection Time: 09/10/18  9:30 PM  Result Value Ref Range Status   Specimen Description BLOOD LEFT ARM  Final   Special Requests   Final    BOTTLES DRAWN AEROBIC AND ANAEROBIC Blood Culture adequate volume   Culture   Final    NO GROWTH 2 DAYS Performed at [facility], [address], [place], [place] [address]    Report Status PENDING  Incomplete  Culture, blood  (Routine x 2)     Status: None (Preliminary result)   Collection Time: 09/10/18  9:52 PM  Result Value Ref Range Status   Specimen Description BLOOD LEFT ARM  Final   Special Requests   Final    BOTTLES DRAWN AEROBIC ONLY Blood Culture results may not be optimal due to an excessive volume of blood received in culture bottles   Culture   Final    NO GROWTH 2 DAYS Performed at [facility], [place] [address] [place], [place] [address]    Report Status PENDING  Incomplete     Scheduled Meds: . enoxaparin (LOVENOX) injection  40 mg Subcutaneous Q24H  . metoprolol tartrate  12.5 mg Oral BID  . sodium chloride flush  3 mL Intravenous Q12H   Continuous Infusions: . cefTRIAXone (ROCEPHIN)  IV 2 g (09/11/18 2214)     LOS: 0 days   Cherene Altes, MD [facility] Office  [phone] Pager - Text Page per Shea Evans  If 7PM-7AM, please contact night-coverage per Amion 09/12/2018, 3:45 PM

## 2018-09-13 DIAGNOSIS — R51 Headache: Secondary | ICD-10-CM

## 2018-09-13 LAB — COMPREHENSIVE METABOLIC PANEL
ALT: 22 U/L (ref 0–44)
AST: 18 U/L (ref 15–41)
Albumin: 2.7 g/dL — ABNORMAL LOW (ref 3.5–5.0)
Alkaline Phosphatase: 96 U/L (ref 38–126)
Anion gap: 10 (ref 5–15)
BUN: 7 mg/dL (ref 6–20)
CO2: 19 mmol/L — ABNORMAL LOW (ref 22–32)
Calcium: 8.4 mg/dL — ABNORMAL LOW (ref 8.9–10.3)
Chloride: 106 mmol/L (ref 98–111)
Creatinine, Ser: 0.74 mg/dL (ref 0.44–1.00)
GFR calc Af Amer: >60 mL/min (ref >60)
GFR calc non Af Amer: >60 mL/min (ref >60)
Glucose, Bld: 94 mg/dL (ref 70–99)
Potassium: 4 mmol/L (ref 3.5–5.1)
Sodium: 135 mmol/L (ref 135–145)
Total Bilirubin: 0.2 mg/dL — ABNORMAL LOW (ref 0.3–1.2)
Total Protein: 6.6 g/dL (ref 6.5–8.1)

## 2018-09-13 LAB — CBC
HCT: 28.4 % — ABNORMAL LOW (ref 36.0–46.0)
Hemoglobin: 8.4 g/dL — ABNORMAL LOW (ref 12.0–15.0)
MCH: 23.1 pg — ABNORMAL LOW (ref 26.0–34.0)
MCHC: 29.6 g/dL — ABNORMAL LOW (ref 30.0–36.0)
MCV: 78.2 fL — ABNORMAL LOW (ref 80.0–100.0)
Platelets: 309 10*3/uL (ref 150–400)
RBC: 3.63 MIL/uL — ABNORMAL LOW (ref 3.87–5.11)
RDW: 16.6 % — AB (ref 11.5–15.5)
WBC: 15.8 10*3/uL — ABNORMAL HIGH (ref 4.0–10.5)
nRBC: 0 % (ref 0.0–0.2)

## 2018-09-13 LAB — MAGNESIUM: Magnesium: 2 mg/dL (ref 1.7–2.4)

## 2018-09-13 LAB — GLUCOSE, CAPILLARY: Glucose-Capillary: 76 mg/dL (ref 70–99)

## 2018-09-13 MED ORDER — IBUPROFEN 400 MG PO TABS
400.0000 mg | ORAL_TABLET | Freq: Four times a day (QID) | ORAL | Status: DC | PRN
  Administered 2018-09-14: 400 mg via ORAL
  Filled 2018-09-13: qty 1

## 2018-09-13 MED ORDER — TRAMADOL HCL 50 MG PO TABS
50.0000 mg | ORAL_TABLET | Freq: Four times a day (QID) | ORAL | Status: DC | PRN
  Administered 2018-09-13 – 2018-09-14 (×2): 50 mg via ORAL
  Filled 2018-09-13 (×2): qty 1

## 2018-09-13 MED ORDER — GUAIFENESIN-DM 100-10 MG/5ML PO SYRP: 5.0000 mL | ORAL_SOLUTION | ORAL | Status: DC | PRN

## 2018-09-13 NOTE — Progress Notes (Signed)
PROGRESS NOTE    Jacqueline Watkins  VCB:[id number] DOB: July 14, 1986 DOA: 09/10/2018 PCP: Patient, No Pcp Per    Brief Narrative:  33 year old female who presented with fever, left flank pain and dysuria.  She does have significant past medical history for nephrocalcinosis and recurrent urinary tract infections.  Her symptoms were worsening for 3 days prior to hospitalization, associated with palpitations.  On the initial physical examination her temperature was 39.4 C her heart rate was 150 bpm, blood pressure 76/45, she had moist mucous membranes, her lungs were clear to auscultation bilaterally, heart S1-S2 present, tachycardic and regular, abdomen with tender in the left flank, no lower extremity edema.  Sodium 138, potassium 3.4, chloride 109, bicarb 20, glucose 100, BUN 8, creatinine 0.87, white count 19.6, hemoglobin 9.6, hematocrit 31.1, platelets 264.  Her urinalysis had 21-50 white cells, 11-20 red cells.  CT of her kidneys showed bilateral medullary nephrocalcinosis, no hydronephrosis or ureteral obstruction.  Chest x-ray with hyperinflation, no infiltrates.  EKG was sinus tachycardia, normal axis, normal intervals.  Patient was admitted to the hospital with a working diagnosis of sepsis due to urinary tract infection.   Assessment & Plan:   Principal Problem:   Sepsis due to urinary tract infection ([place]) Active Problems:   Microcytic anemia   Chronic nephrocalcinosis   1. Sepsis due to urine infection present on admission. Will continue IV antibiotic therapy with ceftriaxone, cultures have been on growth and patient has been afebrile, wbc at 15 from 18. Will continue to follow up temperature curve, cell count and cultures. Will decrease rate of fluids to 50 ml per hour, will discontinue metoprolol and will continue with telemetry monitoring.   2. Chronic nephrocalcinosis. Imaging with no hydronephrosis or obstructive uropathy, preserved renal function. Will need to continue to  follow up as outpatient.   3. Anemia. Hb down to 8,4 from admission 9.6, no signs of acute bleeding, suspected chronic anemia, with component of hemodilution.   4. Acute headache. Will change percocet for tramadol and will add ibuprofen, will continue to monitor response.   DVT prophylaxis: enoxaparin   Code Status: full Family Communication: no family at the bedside  Disposition Plan/ discharge barriers: pending clinical improvement.   Body mass index is 30.45 kg/m. Malnutrition Type:      Malnutrition Characteristics:      Nutrition Interventions:     RN Pressure Injury Documentation:     Consultants:     Procedures:     Antimicrobials:   IV ceftriaxone     Subjective: Patient with moderate to severe headache, associated with nausea, no chest pain or dyspnea. Continue to have left flank pain. No fevers or chills.   Objective: Vitals:   09/12/18 2020 09/13/18 0541 09/13/18 0831 09/13/18 1154  BP: 115/72 106/70 120/74 101/64  Pulse: (!) 109 97 (!) 108 83  Resp: (!) 26 (!) 26  (!) 27  Temp: 98.6 F (37 C) 99 F (37.2 C)  98.2 F (36.8 C)  TempSrc: Oral Oral  Oral  SpO2: 97% 95%  97%  Weight:      Height:        Intake/Output Summary (Last 24 hours) at 09/13/2018 1213 Last data filed at 09/13/2018 0833 Gross per 24 hour  Intake 1562.12 ml  Output 1000 ml  Net 562.12 ml   Filed Weights   09/10/18 2314 09/11/18 0418  Weight: 68 kg 73.1 kg    Examination:   General: deconditioned and in pain Neurology: Awake and alert, non  focal  E ENT: mild pallor, no icterus, oral mucosa moist Cardiovascular: No JVD. S1-S2 present, rhythmic, no gallops, rubs, or murmurs. No lower extremity edema. Pulmonary: positive breath sounds bilaterally, adequate air movement, no wheezing, rhonchi or rales. Gastrointestinal. Abdomen with no organomegaly, non tender, no rebound or guarding Skin. No rashes Musculoskeletal: no joint deformities/ positive left flank  tenderness.      Data Reviewed: I have personally reviewed following labs and imaging studies  CBC: Recent Labs  Lab 09/10/18 2151 09/11/18 0302 09/12/18 0307 09/13/18 0330  WBC 16.8* 19.6* 18.6* 15.8*  NEUTROABS 13.9* 16.2*  --   --   HGB 10.6* 9.6* 8.2* 8.4*  HCT 34.5* 31.1* 27.6* 28.4*  MCV 77.4* 77.9* 78.9* 78.2*  PLT 332 264 247 373   Basic Metabolic Panel: Recent Labs  Lab 09/10/18 2151 09/11/18 0302 09/12/18 0307 09/13/18 0330  NA 136 138 138 135  K 3.4* 3.4* 4.7 4.0  CL 104 109 112* 106  CO2 22 20* 20* 19*  GLUCOSE 124* 100* 100* 94  BUN 10 8 7 7   CREATININE 0.89 0.87 0.73 0.74  CALCIUM 9.2 8.4* 7.8* 8.4*  MG  --   --   --  2.0   GFR: Estimated Creatinine Clearance: 92.3 mL/min (by C-G formula based on SCr of 0.74 mg/dL). Liver Function Tests: Recent Labs  Lab 09/10/18 2151 09/13/18 0330  AST 24 18  ALT 22 22  ALKPHOS 88 96  BILITOT 0.7 0.2*  PROT 7.7 6.6  ALBUMIN 3.8 2.7*   No results for input(s): LIPASE, AMYLASE in the last 168 hours. No results for input(s): AMMONIA in the last 168 hours. Coagulation Profile: Recent Labs  Lab 09/10/18 2151  INR 1.14   Cardiac Enzymes: No results for input(s): CKTOTAL, CKMB, CKMBINDEX, TROPONINI in the last 168 hours. BNP (last 3 results) No results for input(s): PROBNP in the last 8760 hours. HbA1C: No results for input(s): HGBA1C in the last 72 hours. CBG: Recent Labs  Lab 09/12/18 0727 09/13/18 0652  GLUCAP 82 76   Lipid Profile: No results for input(s): CHOL, HDL, LDLCALC, TRIG, CHOLHDL, LDLDIRECT in the last 72 hours. Thyroid Function Tests: No results for input(s): TSH, T4TOTAL, FREET4, T3FREE, THYROIDAB in the last 72 hours. Anemia Panel: No results for input(s): VITAMINB12, FOLATE, FERRITIN, TIBC, IRON, RETICCTPCT in the last 72 hours.    Radiology Studies: I have reviewed all of the imaging during this hospital visit personally     Scheduled Meds: . enoxaparin (LOVENOX)  injection  40 mg Subcutaneous Q24H  . metoprolol tartrate  12.5 mg Oral BID  . sodium chloride flush  3 mL Intravenous Q12H   Continuous Infusions: . sodium chloride 100 mL/hr at 09/13/18 0830  . cefTRIAXone (ROCEPHIN)  IV Stopped (09/12/18 2216)     LOS: 1 day        Kairos Panetta Gerome Apley, MD [facility] Pager [phone]

## 2018-09-14 ENCOUNTER — Inpatient Hospital Stay (HOSPITAL_COMMUNITY): Payer: Medicaid Other

## 2018-09-14 LAB — CBC WITH DIFFERENTIAL/PLATELET
Abs Immature Granulocytes: 0.08 10*3/uL — ABNORMAL HIGH (ref 0.00–0.07)
Basophils Absolute: 0 10*3/uL (ref 0.0–0.1)
Basophils Relative: 0 %
Eosinophils Absolute: 0.2 10*3/uL (ref 0.0–0.5)
Eosinophils Relative: 1 %
HCT: 33.5 % — ABNORMAL LOW (ref 36.0–46.0)
Hemoglobin: 10.3 g/dL — ABNORMAL LOW (ref 12.0–15.0)
Immature Granulocytes: 1 %
Lymphocytes Relative: 21 %
Lymphs Abs: 2.7 10*3/uL (ref 0.7–4.0)
MCH: 23.4 pg — AB (ref 26.0–34.0)
MCHC: 30.7 g/dL (ref 30.0–36.0)
MCV: 76.1 fL — AB (ref 80.0–100.0)
MONO ABS: 1 10*3/uL (ref 0.1–1.0)
Monocytes Relative: 7 %
Neutro Abs: 9.2 10*3/uL — ABNORMAL HIGH (ref 1.7–7.7)
Neutrophils Relative %: 70 %
Platelets: 357 10*3/uL (ref 150–400)
RBC: 4.4 MIL/uL (ref 3.87–5.11)
RDW: 16.5 % — ABNORMAL HIGH (ref 11.5–15.5)
WBC: 13.2 10*3/uL — ABNORMAL HIGH (ref 4.0–10.5)
nRBC: 0 % (ref 0.0–0.2)

## 2018-09-14 LAB — URINE CULTURE: CULTURE: NO GROWTH

## 2018-09-14 LAB — BASIC METABOLIC PANEL
Anion gap: 11 (ref 5–15)
BUN: 9 mg/dL (ref 6–20)
CALCIUM: 8.7 mg/dL — AB (ref 8.9–10.3)
CO2: 18 mmol/L — ABNORMAL LOW (ref 22–32)
Chloride: 107 mmol/L (ref 98–111)
Creatinine, Ser: 0.75 mg/dL (ref 0.44–1.00)
GFR calc Af Amer: >60 mL/min (ref >60)
GFR calc non Af Amer: >60 mL/min (ref >60)
Glucose, Bld: 85 mg/dL (ref 70–99)
Potassium: 4.3 mmol/L (ref 3.5–5.1)
SODIUM: 136 mmol/L (ref 135–145)

## 2018-09-14 MED ORDER — IBUPROFEN 400 MG PO TABS: 400.0000 mg | ORAL_TABLET | Freq: Four times a day (QID) | ORAL | 0 refills | Status: DC | PRN

## 2018-09-14 MED ORDER — CEPHALEXIN 500 MG PO CAPS: 500.0000 mg | ORAL_CAPSULE | Freq: Two times a day (BID) | ORAL | 0 refills | Status: AC

## 2018-09-14 MED ORDER — CEPHALEXIN 500 MG PO CAPS
500.0000 mg | ORAL_CAPSULE | Freq: Two times a day (BID) | ORAL | Status: DC
  Administered 2018-09-14: 500 mg via ORAL
  Filled 2018-09-14: qty 1

## 2018-09-14 MED ORDER — ACETAMINOPHEN 500 MG PO TABS: 500.0000 mg | ORAL_TABLET | Freq: Four times a day (QID) | ORAL | 0 refills | Status: DC | PRN

## 2018-09-14 NOTE — Discharge Summary (Addendum)
Physician Discharge Summary  Jacqueline Watkins QMG:[id number] DOB: 1985/11/12 DOA: 09/10/2018  PCP: Patient, No Pcp Per  Admit date: 09/10/2018 Discharge date: 09/14/2018  Admitted From: Home  Disposition:  Home   Recommendations for Outpatient Follow-up and new medication changes:  1. Follow up with Primary Care in 7 days. 2. Patient placed on Cephalexin 500 mg bid for 5 days.  3. Follow as outpatient with [facility] as scheduled.   Home Health: no   Equipment/Devices: no    Discharge Condition: stable  CODE STATUS: full  Diet recommendation: regular.  Brief/Interim Summary: 33 year old female who presented with fever, left flank pain and dysuria. She does have the significant past medical history for nephrocalcinosis and recurrent urinary tract infections.Her symptoms were worsening for 3 days prior to hospitalization, associated with palpitations. On her initial physical examination her temperature was 39.4 C her heart rate was 150 bpm, blood pressure 76/45,she had moist mucous membranes, her lungs were clear to auscultation bilaterally, heart S1-S2 present, tachycardic and regular, abdomen soft, tenderness in the left flank, no lower extremity edema. Sodium 138, potassium 3.4, chloride 109, bicarb 20, glucose 100, BUN 8, creatinine 0.87, white count 19.6, hemoglobin 9.6, hematocrit 31.1, platelets 264.Her urinalysis had 21-50 white cells, 11-20 red cells. CT of her kidneys showed bilateral medullary nephrocalcinosis,no hydronephrosis or ureteral obstruction.Chest x-ray with hyperinflation, no infiltrates. EKG was sinus tachycardia, normal axis, normal intervals.  Patient was admitted to the hospital with a working diagnosis of sepsis due to urinary tract infection  1.  Sepsis due to urinary tract infection, present on admission.  Patient was admitted to the stepdown unit, she received isotonic IV fluids and IV antibiotic therapy with ceftriaxone, she remained  hemodynamically stable.  Her cultures have remained no growth, she has been afebrile  and her discharge white cell count is down to 13.2.  No signs of volume overload. Patient will continue antibiotic therapy with cephalexin, 500 mg twice daily for the next 5 days.  2.  Chronic nephrocalcinosis.  Her kidney function was followed closely, her creatinine remained stable along with her electrolytes.  3.  Chronic multifactorial anemia.  Her hemoglobin and hematocrit remained stable, discharge hemoglobin 10.3, hematocrit 33.5.   4.  Headache.  Resolved with analgesics.  Discharge Diagnoses:  Principal Problem:   Sepsis due to urinary tract infection ([place]) Active Problems:   Microcytic anemia   Chronic nephrocalcinosis    Discharge Instructions   Allergies as of 09/14/2018   No Known Allergies     Medication List    STOP taking these medications   clindamycin 300 MG capsule Commonly known as:  CLEOCIN   metoCLOPramide 10 MG tablet Commonly known as:  REGLAN   naproxen sodium 220 MG tablet Commonly known as:  ALEVE   OVER THE COUNTER MEDICATION     TAKE these medications   acetaminophen 500 MG tablet Commonly known as:  TYLENOL Take 1 tablet (500 mg total) by mouth every 6 (six) hours as needed for mild pain, fever or headache.   cephALEXin 500 MG capsule Commonly known as:  KEFLEX Take 1 capsule (500 mg total) by mouth every 12 (twelve) hours for 5 days.   ibuprofen 400 MG tablet Commonly known as:  ADVIL,MOTRIN Take 1 tablet (400 mg total) by mouth every 6 (six) hours as needed for headache. What changed:    medication strength  how much to take  reasons to take this       No Known Allergies  Consultations:  Procedures/Studies: Dg Chest Port 1 View  Result Date: 09/11/2018 CLINICAL DATA:  Sepsis EXAM: PORTABLE CHEST 1 VIEW COMPARISON:  05/23/2013 FINDINGS: Shallow inspiration. Linear atelectasis in the lung bases. Normal heart size and pulmonary  vascularity. No focal airspace disease or consolidation in the lungs. No blunting of costophrenic angles. No pneumothorax. Mediastinal contours appear intact. IMPRESSION: Shallow inspiration with linear atelectasis in the lung bases. Electronically Signed   By: Lucienne Capers M.D.   On: 09/11/2018 03:14   Ct Renal Stone Study  Result Date: 09/10/2018 CLINICAL DATA:  Left-sided flank pain and polyuria EXAM: CT ABDOMEN AND PELVIS WITHOUT CONTRAST TECHNIQUE: Multidetector CT imaging of the abdomen and pelvis was performed following the standard protocol without IV contrast. COMPARISON:  None. FINDINGS: LOWER CHEST: There is no basilar pleural or apical pericardial effusion. HEPATOBILIARY: The hepatic contours and density are normal. There is no intra- or extrahepatic biliary dilatation. The gallbladder is normal. PANCREAS: The pancreatic parenchymal contours are normal and there is no ductal dilatation. There is no peripancreatic fluid collection. SPLEEN: Normal. ADRENALS/URINARY TRACT: --Adrenal glands: Normal. --Kidneys: There is bilateral medullary nephrocalcinosis. No hydronephrosis or perinephric stranding. Ureters are nondilated. --Urinary bladder: Normal for degree of distention STOMACH/BOWEL: --Stomach/Duodenum: There is no hiatal hernia or other gastric abnormality. The duodenal course and caliber are normal. --Small bowel: No dilatation or inflammation. --Colon: No focal abnormality. --Appendix: Normal. VASCULAR/LYMPHATIC: Normal course and caliber of the major abdominal vessels. No abdominal or pelvic lymphadenopathy. REPRODUCTIVE: Normal uterus and ovaries. MUSCULOSKELETAL. No bony spinal canal stenosis or focal osseous abnormality. OTHER: None. IMPRESSION: 1. Bilateral medullary nephrocalcinosis, which may be secondary to hyperparathyroidism, renal tubular acidosis, medullary sponge kidney or hypercalcemia. 2. No hydronephrosis or ureteral obstruction. Electronically Signed   By: Ulyses Jarred M.D.    On: 09/10/2018 23:51       Subjective: Patient is feeling well, no nausea or vomiting, has mild chest discomfort with deep inspiration, no frank dyspnea.   Discharge Exam: Vitals:   09/14/18 0545 09/14/18 0810  BP: 102/63 99/66  Pulse:  (!) 121  Resp: 20 16  Temp: 98.1 F (36.7 C) 98.6 F (37 C)  SpO2: 98% 98%   Vitals:   09/13/18 1154 09/13/18 2020 09/14/18 0545 09/14/18 0810  BP: 101/64 120/71 102/63 99/66  Pulse: 83 (!) 104  (!) 121  Resp: (!) 27 (!) 31 20 16   Temp: 98.2 F (36.8 C) 98.5 F (36.9 C) 98.1 F (36.7 C) 98.6 F (37 C)  TempSrc: Oral Oral Oral Oral  SpO2: 97% 99% 98% 98%  Weight:      Height:        General: Not in pain or dyspnea  Neurology: Awake and alert, non focal  E ENT: no pallor, no icterus, oral mucosa moist Cardiovascular: No JVD. S1-S2 present, rhythmic, no gallops, rubs, or murmurs. No lower extremity edema. Pulmonary: vesicular breath sounds bilaterally, adequate air movement, no wheezing, rhonchi or rales. Gastrointestinal. Abdomen with no organomegaly, non tender, no rebound or guarding Skin. No rashes Musculoskeletal: no joint deformities   The results of significant diagnostics from this hospitalization (including imaging, microbiology, ancillary and laboratory) are listed below for reference.     Microbiology: Recent Results (from the past 240 hour(s))  Culture, blood (Routine x 2)     Status: None (Preliminary result)   Collection Time: 09/10/18  9:30 PM  Result Value Ref Range Status   Specimen Description BLOOD LEFT ARM  Final   Special Requests   Final  BOTTLES DRAWN AEROBIC AND ANAEROBIC Blood Culture adequate volume   Culture   Final    NO GROWTH 3 DAYS Performed at [facility], [place] [address], [place], [place] [address]    Report Status PENDING  Incomplete  Culture, blood (Routine x 2)     Status: None (Preliminary result)   Collection Time: 09/10/18  9:52 PM  Result Value Ref Range Status   Specimen  Description BLOOD LEFT ARM  Final   Special Requests   Final    BOTTLES DRAWN AEROBIC ONLY Blood Culture results may not be optimal due to an excessive volume of blood received in culture bottles   Culture   Final    NO GROWTH 3 DAYS Performed at [facility], [place] [address], [place], [place] [address]    Report Status PENDING  Incomplete  Culture, Urine     Status: None   Collection Time: 09/13/18 12:08 AM  Result Value Ref Range Status   Specimen Description URINE, RANDOM  Final   Special Requests NONE  Final   Culture   Final    NO GROWTH Performed at [facility], [place] [address], [place], [place] [address]    Report Status 09/14/2018 FINAL  Final     Labs: BNP (last 3 results) No results for input(s): BNP in the last 8760 hours. Basic Metabolic Panel: Recent Labs  Lab 09/10/18 2151 09/11/18 0302 09/12/18 0307 09/13/18 0330 09/14/18 0357  NA 136 138 138 135 136  K 3.4* 3.4* 4.7 4.0 4.3  CL 104 109 112* 106 107  CO2 22 20* 20* 19* 18*  GLUCOSE 124* 100* 100* 94 85  BUN 10 8 7 7 9   CREATININE 0.89 0.87 0.73 0.74 0.75  CALCIUM 9.2 8.4* 7.8* 8.4* 8.7*  MG  --   --   --  2.0  --    Liver Function Tests: Recent Labs  Lab 09/10/18 2151 09/13/18 0330  AST 24 18  ALT 22 22  ALKPHOS 88 96  BILITOT 0.7 0.2*  PROT 7.7 6.6  ALBUMIN 3.8 2.7*   No results for input(s): LIPASE, AMYLASE in the last 168 hours. No results for input(s): AMMONIA in the last 168 hours. CBC: Recent Labs  Lab 09/10/18 2151 09/11/18 0302 09/12/18 0307 09/13/18 0330 09/14/18 0357  WBC 16.8* 19.6* 18.6* 15.8* 13.2*  NEUTROABS 13.9* 16.2*  --   --  9.2*  HGB 10.6* 9.6* 8.2* 8.4* 10.3*  HCT 34.5* 31.1* 27.6* 28.4* 33.5*  MCV 77.4* 77.9* 78.9* 78.2* 76.1*  PLT 332 264 247 309 357   Cardiac Enzymes: No results for input(s): CKTOTAL, CKMB, CKMBINDEX, TROPONINI in the last 168 hours. BNP: Invalid input(s): POCBNP CBG: Recent Labs  Lab 09/12/18 0727 09/13/18 0652  GLUCAP 82  76   D-Dimer No results for input(s): DDIMER in the last 72 hours. Hgb A1c No results for input(s): HGBA1C in the last 72 hours. Lipid Profile No results for input(s): CHOL, HDL, LDLCALC, TRIG, CHOLHDL, LDLDIRECT in the last 72 hours. Thyroid function studies No results for input(s): TSH, T4TOTAL, T3FREE, THYROIDAB in the last 72 hours.  Invalid input(s): FREET3 Anemia work up No results for input(s): VITAMINB12, FOLATE, FERRITIN, TIBC, IRON, RETICCTPCT in the last 72 hours. Urinalysis    Component Value Date/Time   COLORURINE YELLOW 09/10/2018 2347   APPEARANCEUR CLEAR 09/10/2018 2347   LABSPEC 1.008 09/10/2018 2347   PHURINE 7.0 09/10/2018 2347   GLUCOSEU NEGATIVE 09/10/2018 2347   HGBUR  LARGE (A) 09/10/2018 2347   BILIRUBINUR NEGATIVE 09/10/2018 2347   KETONESUR NEGATIVE 09/10/2018 2347   PROTEINUR NEGATIVE 09/10/2018 2347   UROBILINOGEN 0.2 02/04/2008 1734   NITRITE NEGATIVE 09/10/2018 2347   LEUKOCYTESUR LARGE (A) 09/10/2018 2347   Sepsis Labs Invalid input(s): PROCALCITONIN,  WBC,  LACTICIDVEN Microbiology Recent Results (from the past 240 hour(s))  Culture, blood (Routine x 2)     Status: None (Preliminary result)   Collection Time: 09/10/18  9:30 PM  Result Value Ref Range Status   Specimen Description BLOOD LEFT ARM  Final   Special Requests   Final    BOTTLES DRAWN AEROBIC AND ANAEROBIC Blood Culture adequate volume   Culture   Final    NO GROWTH 3 DAYS Performed at [facility], [place] [address] [place], [place] [address]    Report Status PENDING  Incomplete  Culture, blood (Routine x 2)     Status: None (Preliminary result)   Collection Time: 09/10/18  9:52 PM  Result Value Ref Range Status   Specimen Description BLOOD LEFT ARM  Final   Special Requests   Final    BOTTLES DRAWN AEROBIC ONLY Blood Culture results may not be optimal due to an excessive volume of blood received in culture bottles   Culture   Final    NO GROWTH 3 DAYS Performed at  [facility], [place] [address], [place], [place] [address]    Report Status PENDING  Incomplete  Culture, Urine     Status: None   Collection Time: 09/13/18 12:08 AM  Result Value Ref Range Status   Specimen Description URINE, RANDOM  Final   Special Requests NONE  Final   Culture   Final    NO GROWTH Performed at [facility], [place] [address] [place],  [address]    Report Status 09/14/2018 FINAL  Final     Time coordinating discharge: 45 minutes  SIGNED:   Tawni Millers, MD  [facility] 09/14/2018, 12:54 PM Pager [phone]  If 7PM-7AM, please contact night-coverage [email or web address] Password [email or web address]

## 2018-09-14 NOTE — Progress Notes (Signed)
Patient given discharge instructions and medication list  Along with instructions of follow up appointments. These were presented and given to patient in [place]. IV and tele were dcd will discharge home as ordered. Griffon Herberg, Bettina Gavia RN

## 2018-09-15 LAB — CULTURE, BLOOD (ROUTINE X 2)
Culture: NO GROWTH
Culture: NO GROWTH
Special Requests: ADEQUATE

## 2018-09-22 ENCOUNTER — Emergency Department (HOSPITAL_COMMUNITY)
Admission: EM | Admit: 2018-09-22 | Discharge: 2018-09-23 | Disposition: A | Discharge status: Home / Self Care | Payer: Self-pay | Attending: Emergency Medicine | Admitting: Emergency Medicine

## 2018-09-22 ENCOUNTER — Encounter (HOSPITAL_COMMUNITY): Payer: Self-pay | Admitting: Emergency Medicine

## 2018-09-22 ENCOUNTER — Other Ambulatory Visit: Payer: Self-pay

## 2018-09-22 DIAGNOSIS — R110 Nausea: Secondary | ICD-10-CM | POA: Insufficient documentation

## 2018-09-22 DIAGNOSIS — R079 Chest pain, unspecified: Secondary | ICD-10-CM | POA: Insufficient documentation

## 2018-09-22 DIAGNOSIS — Z5321 Procedure and treatment not carried out due to patient leaving prior to being seen by health care provider: Secondary | ICD-10-CM | POA: Insufficient documentation

## 2018-09-22 DIAGNOSIS — R51 Headache: Secondary | ICD-10-CM | POA: Insufficient documentation

## 2018-09-22 NOTE — ED Notes (Signed)
Called pt x2 for vitals, no response. °

## 2018-09-22 NOTE — ED Triage Notes (Signed)
Sister in law stated, headache in the back of the head, goes to shoulder and pressure in the chest. Has had 2 nose bleeds. Went to Dr today to chck her BP and it was fine. Dr told me to come here because of her recent sepsis .

## 2018-12-06 ENCOUNTER — Emergency Department (HOSPITAL_COMMUNITY)
Admission: EM | Admit: 2018-12-06 | Discharge: 2018-12-06 | Disposition: A | Discharge status: Home / Self Care | Payer: Self-pay | Attending: Emergency Medicine | Admitting: Emergency Medicine

## 2018-12-06 ENCOUNTER — Emergency Department (HOSPITAL_COMMUNITY): Payer: Self-pay

## 2018-12-06 ENCOUNTER — Other Ambulatory Visit: Payer: Self-pay

## 2018-12-06 ENCOUNTER — Encounter (HOSPITAL_COMMUNITY): Payer: Self-pay | Admitting: *Deleted

## 2018-12-06 DIAGNOSIS — D729 Disorder of white blood cells, unspecified: Secondary | ICD-10-CM | POA: Insufficient documentation

## 2018-12-06 DIAGNOSIS — K429 Umbilical hernia without obstruction or gangrene: Secondary | ICD-10-CM

## 2018-12-06 DIAGNOSIS — N12 Tubulo-interstitial nephritis, not specified as acute or chronic: Secondary | ICD-10-CM | POA: Insufficient documentation

## 2018-12-06 DIAGNOSIS — D72828 Other elevated white blood cell count: Secondary | ICD-10-CM | POA: Insufficient documentation

## 2018-12-06 HISTORY — DX: Umbilical hernia without obstruction or gangrene: K42.9

## 2018-12-06 LAB — URINALYSIS, ROUTINE W REFLEX MICROSCOPIC
Bilirubin Urine: NEGATIVE
Glucose, UA: NEGATIVE mg/dL
Hgb urine dipstick: NEGATIVE
Ketones, ur: NEGATIVE mg/dL
Nitrite: NEGATIVE
Protein, ur: NEGATIVE mg/dL
Specific Gravity, Urine: 1.008 (ref 1.005–1.030)
pH: 7 (ref 5.0–8.0)

## 2018-12-06 LAB — CBC WITH DIFFERENTIAL/PLATELET
Abs Immature Granulocytes: 0.03 10*3/uL (ref 0.00–0.07)
Basophils Absolute: 0 10*3/uL (ref 0.0–0.1)
Basophils Relative: 0 %
Eosinophils Absolute: 0.1 10*3/uL (ref 0.0–0.5)
Eosinophils Relative: 1 %
HCT: 34.5 % — ABNORMAL LOW (ref 36.0–46.0)
Hemoglobin: 10 g/dL — ABNORMAL LOW (ref 12.0–15.0)
Immature Granulocytes: 0 %
Lymphocytes Relative: 20 %
Lymphs Abs: 2.2 10*3/uL (ref 0.7–4.0)
MCH: 22.2 pg — ABNORMAL LOW (ref 26.0–34.0)
MCHC: 29 g/dL — ABNORMAL LOW (ref 30.0–36.0)
MCV: 76.7 fL — ABNORMAL LOW (ref 80.0–100.0)
Monocytes Absolute: 0.7 10*3/uL (ref 0.1–1.0)
Monocytes Relative: 6 %
Neutro Abs: 8 10*3/uL — ABNORMAL HIGH (ref 1.7–7.7)
Neutrophils Relative %: 73 %
Platelets: 381 10*3/uL (ref 150–400)
RBC: 4.5 MIL/uL (ref 3.87–5.11)
RDW: 16.2 % — ABNORMAL HIGH (ref 11.5–15.5)
WBC: 11.1 10*3/uL — ABNORMAL HIGH (ref 4.0–10.5)
nRBC: 0 % (ref 0.0–0.2)

## 2018-12-06 LAB — COMPREHENSIVE METABOLIC PANEL
ALT: 24 U/L (ref 0–44)
AST: 31 U/L (ref 15–41)
Albumin: 3.6 g/dL (ref 3.5–5.0)
Alkaline Phosphatase: 93 U/L (ref 38–126)
Anion gap: 12 (ref 5–15)
BUN: 10 mg/dL (ref 6–20)
CO2: 22 mmol/L (ref 22–32)
Calcium: 9.1 mg/dL (ref 8.9–10.3)
Chloride: 104 mmol/L (ref 98–111)
Creatinine, Ser: 0.79 mg/dL (ref 0.44–1.00)
GFR calc Af Amer: >60 mL/min (ref >60)
GFR calc non Af Amer: >60 mL/min (ref >60)
Glucose, Bld: 111 mg/dL — ABNORMAL HIGH (ref 70–99)
Potassium: 4 mmol/L (ref 3.5–5.1)
Sodium: 138 mmol/L (ref 135–145)
Total Bilirubin: 0.7 mg/dL (ref 0.3–1.2)
Total Protein: 7.7 g/dL (ref 6.5–8.1)

## 2018-12-06 LAB — LACTIC ACID, PLASMA
Lactic Acid, Venous: 1.8 mmol/L (ref 0.5–1.9)
Lactic Acid, Venous: 1.1 mmol/L (ref 0.5–1.9)

## 2018-12-06 LAB — PREGNANCY, URINE: Preg Test, Ur: NEGATIVE

## 2018-12-06 MED ORDER — CEPHALEXIN 500 MG PO CAPS: 500.0000 mg | ORAL_CAPSULE | Freq: Four times a day (QID) | ORAL | 0 refills | Status: AC

## 2018-12-06 MED ORDER — SODIUM CHLORIDE 0.9 % IV SOLN
1.0000 g | Freq: Once | INTRAVENOUS | Status: AC
  Administered 2018-12-06: 18:00:00 1 g via INTRAVENOUS
  Filled 2018-12-06: qty 10

## 2018-12-06 MED ORDER — SODIUM CHLORIDE 0.9 % IV BOLUS
1000.0000 mL | Freq: Once | INTRAVENOUS | Status: AC
  Administered 2018-12-06: 1000 mL via INTRAVENOUS

## 2018-12-06 NOTE — ED Provider Notes (Signed)
I assumed care of patient from Dr. Ralene Bathe at shift change, please see her note for full H&P.  Briefly patient is here because her primary care doctor told her that she was concerned she may be septic as she had a slight leukocytosis and sent her here.  She has been taking a fluoroquinolone at home for her infection.  Physical Exam  BP 108/69 (BP Location: Right Arm)   Pulse (!) 108   Temp 99.5 F (37.5 C)   Resp 16   SpO2 100%   Physical Exam Vitals signs and nursing note reviewed.  Constitutional:      General: She is not in acute distress. Pulmonary:     Effort: No respiratory distress.  Neurological:     General: No focal deficit present.     Mental Status: She is alert.  Psychiatric:        Mood and Affect: Mood normal.        Behavior: Behavior normal.    Ct Renal Stone Study  Result Date: 12/06/2018 CLINICAL DATA:  Flank pain. Patient reports left lower abdominal pain. Nausea. EXAM: CT ABDOMEN AND PELVIS WITHOUT CONTRAST TECHNIQUE: Multidetector CT imaging of the abdomen and pelvis was performed following the standard protocol without IV contrast. COMPARISON:  CT 09/10/2018 FINDINGS: Lower chest: Breathing motion artifact. No obvious consolidation or large pleural effusion. Hepatobiliary: Mild motion artifact limitations. Allowing for this, no evidence of focal lesion. Gallbladder partially distended without calcified gallstone. No biliary dilatation. Pancreas: No ductal dilatation or inflammation. Spleen: Normal in size without focal abnormality. Adrenals/Urinary Tract: Normal adrenal glands. Bilateral medullary nephrocalcinosis, unchanged in appearance from prior exam. No hydronephrosis or perinephric edema. Both ureters are decompressed without stones along the course. Urinary bladder is physiologically distended. No bladder wall thickening or stone. Stomach/Bowel: Ingested material in the stomach without gastric wall thickening. No bowel wall thickening, inflammatory change, or  obstruction. Moderate colonic stool burden without colonic inflammation. Normal appendix. Vascular/Lymphatic: Prominent upper retroperitoneal left periaortic nodes measuring up to 11 mm, unchanged from prior exam. Scattered mesenteric nodes are not enlarged by size criteria. Normal course and caliber of abdominopelvic vasculature. Reproductive: Possible 3.2 cm complex cyst in the right ovary. Uterus and left adnexa are unremarkable. Other: Small fat containing umbilical hernia. No free air, free fluid, or intra-abdominal fluid collection. Musculoskeletal: There are no acute or suspicious osseous abnormalities. IMPRESSION: 1. No hydronephrosis or obstructive uropathy. No acute findings in the abdomen/pelvis. 2. Bilateral medullary nephrocalcinosis, unchanged from January CT. 3. Small fat containing umbilical hernia. Electronically Signed   By: Keith Rake M.D.   On: 12/06/2018 20:59    Labs Reviewed  COMPREHENSIVE METABOLIC PANEL - Abnormal; Notable for the following components:      Result Value   Glucose, Bld 111 (*)    All other components within normal limits  CBC WITH DIFFERENTIAL/PLATELET - Abnormal; Notable for the following components:   WBC 11.1 (*)    Hemoglobin 10.0 (*)    HCT 34.5 (*)    MCV 76.7 (*)    MCH 22.2 (*)    MCHC 29.0 (*)    RDW 16.2 (*)    Neutro Abs 8.0 (*)    All other components within normal limits  URINALYSIS, ROUTINE W REFLEX MICROSCOPIC - Abnormal; Notable for the following components:   Color, Urine STRAW (*)    Leukocytes,Ua MODERATE (*)    Bacteria, UA FEW (*)    All other components within normal limits  CULTURE, BLOOD (ROUTINE X 2)  CULTURE, BLOOD (ROUTINE X 2)  URINE CULTURE  LACTIC ACID, PLASMA  LACTIC ACID, PLASMA  PREGNANCY, URINE    ED Course/Procedures     Procedures  MDM  F/u labs, CT (if no obstruction ok to D/C even with renal stones), re-eval, if labs look ok and she looks ok then d/c home  CT reviewed, patient does not have  evidence of obstruction.  Labs were reviewed without significant derangements.  Per plan will discharge her home with Keflex in addition to her current antibiotic.  Discussed all results with patient.  Recommended PCP follow-up.  She reports generally feeling well without other concerns.  She is not tachycardic or febrile at the time of discharge.  Return precautions were discussed with patient who states their understanding.  At the time of discharge patient denied any unaddressed complaints or concerns.  Patient is agreeable for discharge home.      Lorin Glass, PA-C 12/06/18 2238    Little, Wenda Overland, MD 12/06/18 [phone]

## 2018-12-06 NOTE — ED Triage Notes (Signed)
Lab request for [facility]nd [facility]

## 2018-12-06 NOTE — ED Triage Notes (Signed)
Pt was seen and treated by her PCP Tuesday this week. Pt reports she was instructed to come to ED on Tuesday bujt did not want to. Pt was sent home with PO anti-Bx  To take. Pt comes here today because she received a call from PCP telling her the home meds were not strong enough. Pt reports she was told to come to ED.

## 2018-12-06 NOTE — ED Notes (Signed)
Taken to CT at this time. 

## 2018-12-06 NOTE — ED Triage Notes (Signed)
PT difficult stick with small v eins . Could only obtain 1 set of cultures.

## 2018-12-06 NOTE — ED Notes (Signed)
Discharge instructions, prescription, and follow up care discussed with pt. Pt. verbalized understanding and no other questions at this time.

## 2018-12-06 NOTE — ED Provider Notes (Signed)
[facility] EMERGENCY DEPARTMENT Provider Note   CSN: [id number] Arrival date & time: 12/06/18  1643    History   Chief Complaint Chief Complaint  Patient presents with  . Urinary Tract Infection    HPI Jacqueline Watkins is a 33 y.o. female.     The history is provided by the patient and medical records. A language interpreter was used.  Urinary Tract Infection   Jacqueline Watkins is a 33 y.o. female who presents to the Emergency Department complaining of UTI. She presents to the emergency department complaining of urinary tract infection. She began feeling poorly five days ago with left sided flank pain and fevers, nausea. She saw her PCP four days ago and was diagnosed with a urinary tract infection and had blood work drawn and was started on Levaquin. She was noted to be febrile to 104 on that visit and it was recommended that she go to the emergency department but she declined. She did have vomiting four days ago but this is now resolved. She complains of persistent left flank pain that radiates to the left lower quadrant with associated nausea, fevers and dysuria. She does state that her symptoms are mildly improved when compared to four days ago. She has been compliant with her medications. Her doctor contacted her today when her labs were back and told her to go to the emergency department immediately due to concern for sepsis. The report is that her white blood cell count was high and that her urine grew out E. coli. History reviewed. No pertinent past medical history.  Patient Active Problem List   Diagnosis Date Noted  . Sepsis due to urinary tract infection ([place]) 09/11/2018  . Microcytic anemia 09/11/2018  . 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 Pyelonephritis     Past Surgical History:  Procedure Laterality Date  . CESAREAN SECTION    . NO PAST SURGERIES       OB History    Gravida  3   Para  3   Term  3   Preterm  0   AB  0   Living  3     SAB  0   TAB  0   Ectopic  0   Multiple  0   Live Births  1            Home Medications    Prior to Admission medications   Medication Sig Start Date End Date Taking? Authorizing Provider  acetaminophen (TYLENOL) 500 MG tablet Take 1 tablet (500 mg total) by mouth every 6 (six) hours as needed for mild pain, fever or headache. 09/14/18   Arrien, Jimmy Picket, MD  ibuprofen (ADVIL,MOTRIN) 400 MG tablet Take 1 tablet (400 mg total) by mouth every 6 (six) hours as needed for headache. 09/14/18   Arrien, Jimmy Picket, MD    Family History Family History  Problem Relation Age of Onset  . Hypertension Mother     Social History Social History   Tobacco Use  . Smoking status: Never Smoker  . Smokeless tobacco: Never Used  Substance Use Topics  . Alcohol use: No  . Drug use: No     Allergies   Patient has no known allergies.   Review of Systems Review of Systems  All other systems reviewed and are negative.    Physical Exam Updated Vital Signs BP 108/69 (BP Location: Right Arm)   Pulse (!) 108   Temp 99.5 F (37.5 C)   Resp  16   SpO2 100%   Physical Exam Vitals signs and nursing note reviewed.  Constitutional:      Appearance: She is well-developed.  HENT:     Head: Normocephalic and atraumatic.  Cardiovascular:     Rate and Rhythm: Regular rhythm.     Comments: tachycardic Pulmonary:     Effort: Pulmonary effort is normal. No respiratory distress.  Abdominal:     Tenderness: There is no guarding or rebound.     Comments: Mild left cva tenderness, mild left lower quadrant/lower abdominal tenderness  Musculoskeletal:        General: No swelling or tenderness.  Skin:    General: Skin is warm and dry.  Neurological:     Mental Status: She is alert and oriented to person, place, and time.  Psychiatric:        Mood and Affect: Mood normal.        Behavior: Behavior normal.      ED Treatments / Results  Labs (all labs  ordered are listed, but only abnormal results are displayed) Labs Reviewed  CBC WITH DIFFERENTIAL/PLATELET - Abnormal; Notable for the following components:      Result Value   WBC 11.1 (*)    Hemoglobin 10.0 (*)    HCT 34.5 (*)    MCV 76.7 (*)    MCH 22.2 (*)    MCHC 29.0 (*)    RDW 16.2 (*)    Neutro Abs 8.0 (*)    All other components within normal limits  CULTURE, BLOOD (ROUTINE X 2)  CULTURE, BLOOD (ROUTINE X 2)  URINE CULTURE  LACTIC ACID, PLASMA  PREGNANCY, URINE  LACTIC ACID, PLASMA  COMPREHENSIVE METABOLIC PANEL  URINALYSIS, ROUTINE W REFLEX MICROSCOPIC    EKG None  Radiology No results found.  Procedures Procedures (including critical care time)  Medications Ordered in ED Medications  cefTRIAXone (ROCEPHIN) 1 g in sodium chloride 0.9 % 100 mL IVPB (1 g Intravenous New Bag/Given 12/06/18 1822)  sodium chloride 0.9 % bolus 1,000 mL (1,000 mLs Intravenous New Bag/Given 12/06/18 1823)     Initial Impression / Assessment and Plan / ED Course  I have reviewed the triage vital signs and the nursing notes.  Pertinent labs & imaging results that were available during my care of the patient were reviewed by me and considered in my medical decision making (see chart for details).         Patient with history of medullary sponge kidney here for evaluation of flank pain. She is mildly tachycardic on examination but non-toxic appearing. She is currently taken three days of Levaquin. Will treat with IV fluids, Rocephin pending workup. Plan to obtain labs to further evaluate for possible sepsis. Patient care transferred pending labs.  Final Clinical Impressions(s) / ED Diagnoses   Final diagnoses:  None    ED Discharge Orders    None       Quintella Reichert, MD 12/06/18 [name]

## 2018-12-07 LAB — URINE CULTURE: Culture: NO GROWTH

## 2018-12-11 LAB — CULTURE, BLOOD (ROUTINE X 2)
Culture: NO GROWTH
Culture: NO GROWTH
Special Requests: ADEQUATE

## 2018-12-24 DIAGNOSIS — D509 Iron deficiency anemia, unspecified: Secondary | ICD-10-CM

## 2018-12-24 HISTORY — DX: Iron deficiency anemia, unspecified: D50.9

## 2020-01-25 IMAGING — DX DG CHEST 1V PORT
1 series · 1 of 1 positions shown · non-contrast
Comparison: 05/23/2013

CLINICAL DATA: Sepsis

EXAM:
PORTABLE CHEST 1 VIEW

[chest ap]
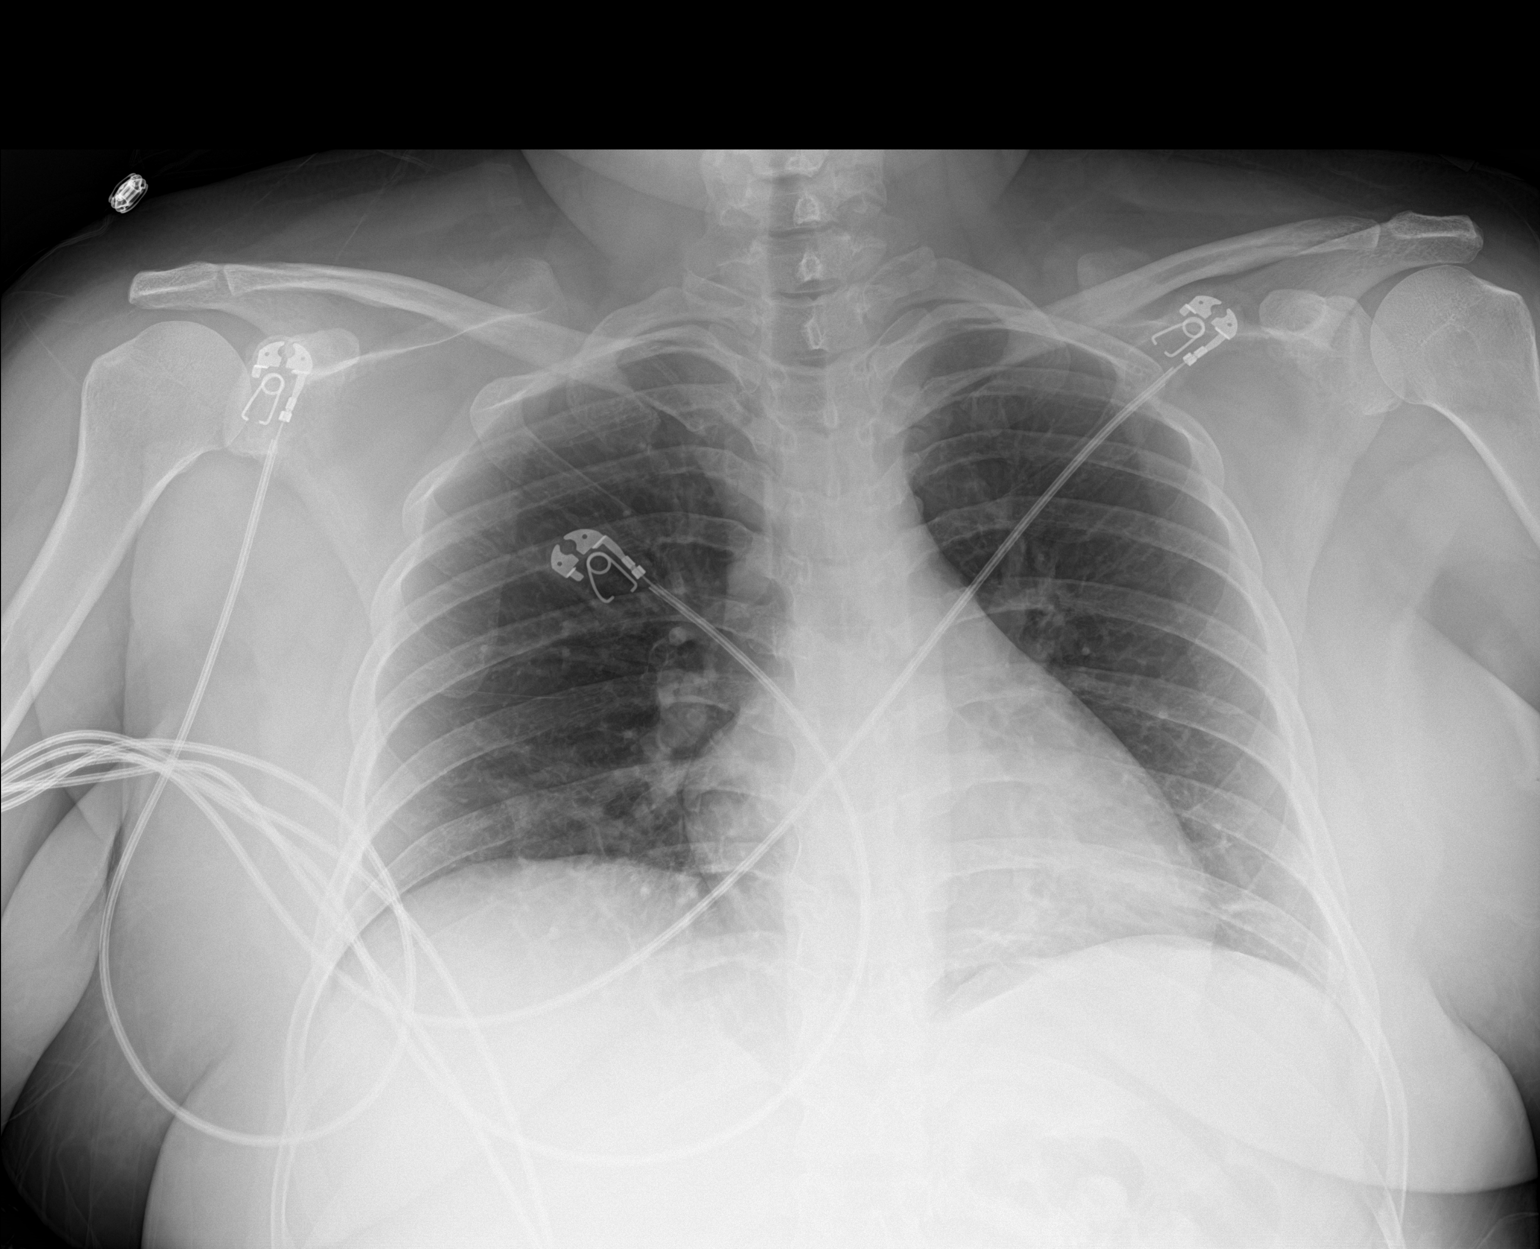

[1 of 1 positions shown; findings below may reference images not displayed]

FINDINGS: Shallow inspiration. Linear atelectasis in the lung bases. Normal
heart size and pulmonary vascularity. No focal airspace disease or
consolidation in the lungs. No blunting of costophrenic angles. No
pneumothorax. Mediastinal contours appear intact.
IMPRESSION: Shallow inspiration with linear atelectasis in the lung bases.

## 2020-08-27 NOTE — L&D Delivery Note (Signed)
Faculty Note  Called to room for 16 week delivery secondary to PPROM.  Upon arrival the fetus had already delivered at 0245 and there were no obvious signs of life. There was about 200-300 ml of clot and blood noted on the perineum; however, there did not appear to be active bleeding. The cord had already been doubly clamped.  MD cut the cord and the fetus was swaddled.  Through interpreter the patient was asked to gently push, and with light traction the placenta delivered in its entirety at 0258.  No vaginal/ perineal lacerations were noted.  No active bleeding was noted after placental delivery.Pitocin was started per protocol.  Postpartum labs have been ordered as well as pastoral care consult.  Lynnda Shields, Md Faculty Attending, [facility] for [employer].

## 2020-11-03 ENCOUNTER — Other Ambulatory Visit: Payer: Self-pay

## 2020-11-03 ENCOUNTER — Observation Stay (HOSPITAL_COMMUNITY)
Admission: AD | Admit: 2020-11-03 | Discharge: 2020-11-04 | Disposition: A | Discharge status: Home / Self Care | Payer: Self-pay | Attending: Emergency Medicine | Admitting: Emergency Medicine

## 2020-11-03 ENCOUNTER — Inpatient Hospital Stay (HOSPITAL_BASED_OUTPATIENT_CLINIC_OR_DEPARTMENT_OTHER): Payer: Self-pay

## 2020-11-03 ENCOUNTER — Encounter (HOSPITAL_COMMUNITY): Payer: Self-pay

## 2020-11-03 DIAGNOSIS — O99012 Anemia complicating pregnancy, second trimester: Secondary | ICD-10-CM | POA: Insufficient documentation

## 2020-11-03 DIAGNOSIS — D62 Acute posthemorrhagic anemia: Secondary | ICD-10-CM | POA: Insufficient documentation

## 2020-11-03 DIAGNOSIS — O039 Complete or unspecified spontaneous abortion without complication: Secondary | ICD-10-CM | POA: Insufficient documentation

## 2020-11-03 DIAGNOSIS — O42919 Preterm premature rupture of membranes, unspecified as to length of time between rupture and onset of labor, unspecified trimester: Secondary | ICD-10-CM | POA: Diagnosis present

## 2020-11-03 DIAGNOSIS — Z3A15 15 weeks gestation of pregnancy: Secondary | ICD-10-CM | POA: Insufficient documentation

## 2020-11-03 DIAGNOSIS — O42912 Preterm premature rupture of membranes, unspecified as to length of time between rupture and onset of labor, second trimester: Secondary | ICD-10-CM

## 2020-11-03 DIAGNOSIS — Z20822 Contact with and (suspected) exposure to covid-19: Secondary | ICD-10-CM | POA: Insufficient documentation

## 2020-11-03 DIAGNOSIS — O4290 Premature rupture of membranes, unspecified as to length of time between rupture and onset of labor, unspecified weeks of gestation: Secondary | ICD-10-CM

## 2020-11-03 DIAGNOSIS — O42012 Preterm premature rupture of membranes, onset of labor within 24 hours of rupture, second trimester: Principal | ICD-10-CM | POA: Insufficient documentation

## 2020-11-03 HISTORY — DX: Other specified health status: Z78.9

## 2020-11-03 HISTORY — DX: Tubulo-interstitial nephritis, not specified as acute or chronic: N12

## 2020-11-03 LAB — I-STAT BETA HCG BLOOD, ED (MC, WL, AP ONLY): I-stat hCG, quantitative: >2000 m[IU]/mL — ABNORMAL HIGH (ref <5)

## 2020-11-03 MED ORDER — CALCIUM CARBONATE ANTACID 500 MG PO CHEW: 2.0000 | CHEWABLE_TABLET | ORAL | Status: DC | PRN

## 2020-11-03 MED ORDER — PRENATAL MULTIVITAMIN CH: 1.0000 | ORAL_TABLET | Freq: Every day | ORAL | Status: DC

## 2020-11-03 MED ORDER — DOCUSATE SODIUM 100 MG PO CAPS
100.0000 mg | ORAL_CAPSULE | Freq: Every day | ORAL | Status: DC
  Administered 2020-11-04: 100 mg via ORAL
  Filled 2020-11-03: qty 1

## 2020-11-03 MED ORDER — ACETAMINOPHEN 325 MG PO TABS
650.0000 mg | ORAL_TABLET | ORAL | Status: DC | PRN
  Administered 2020-11-04: 650 mg via ORAL
  Filled 2020-11-03: qty 2

## 2020-11-03 MED ORDER — ZOLPIDEM TARTRATE 5 MG PO TABS
5.0000 mg | ORAL_TABLET | Freq: Every evening | ORAL | Status: DC | PRN
  Administered 2020-11-04: 5 mg via ORAL
  Filled 2020-11-03: qty 1

## 2020-11-03 NOTE — MAU Note (Signed)
Dr.Eckstat in - discussed with patient need for admission and results of ultrasound. Pt agreeable with plan

## 2020-11-03 NOTE — MAU Note (Signed)
Dr.Eckstat in with bedside ultrasound. RN upon entering room noted pt standing infront of bathroom door holding abdomen with light pink amniotic fluid running on to floor. Pt assisted to bed

## 2020-11-03 NOTE — ED Triage Notes (Signed)
76mos pregnant - lower abdominal pain radiating to the back and headache x 1 week.

## 2020-11-03 NOTE — MAU Note (Signed)
Cant amount of amniotic fluid noted on peri pad-no vaginal bleeding or bloody show. Uterus is soft and non tender between contractions

## 2020-11-03 NOTE — MAU Note (Signed)
Pt reports contraction pain increasing in frequency and intensity- rates pain at 9 on 1-10 scale

## 2020-11-03 NOTE — ED Notes (Signed)
Provider at bedside

## 2020-11-03 NOTE — ED Provider Notes (Signed)
Emergency Medicine Provider OB Triage Evaluation Note  Jacqueline Watkins is a 35 y.o. female, [phone], at Unknown gestation who presents to the emergency department with complaints of dental pain rating to her back.  Last menstrual cycle in November.  Has had lower abdominal cramping x1 week.  She has not followed with OB/GYN.  Denies any vaginal bleeding, fluid leakage.  States she has an appointment to follow up with OB/GYN in approximately 1 week.  She does not know the office of this.  She is also had a headache.  She denies any vision changes.  No trauma.  No upper abdominal pain.  Not take anything for her symptoms.  No documented positive pregnancy test in epic.  Medical Spanish interpretor was used.  Review of  Systems  Positive: Abdominal pain, lower back pain, headache Negative: Lightheadedness, dizziness, vision changes, paresthesias, weakness, epigastric pain, vaginal bleeding, fluid leakage.  Physical Exam  BP 119/77 (BP Location: Right Arm)   Pulse (!) 106   Temp 98.3 F (36.8 C) (Oral)   Resp 18   Ht 5\' 1"  (1.549 m)   Wt 73.1 kg   LMP 07/15/2020   SpO2 99%   BMI 30.45 kg/m  General: Awake, no distress  HEENT: Atraumatic  Resp: Normal effort  Cardiac: Normal rate Abd: Nondistended, nontender  MSK: Moves all extremities without difficulty Neuro: Speech clear  Medical Decision Making  Pt evaluated for pregnancy concern and is stable for transfer to MAU. Pt is in agreement with plan for transfer.  8:13 PM Discussed with MAU MD, Dr. Elgie Congo who accepts patient in transfer.  Clinical Impression   Obtain pregnancy test, if positive we will plan on consulting with ObGyn Regards to her headache.  She appears to have a nonfocal neuro exam without deficits.  No recent trauma or injury. Rates her Headache a 1/10 however states her Abd pain is a 9/10. Would likely treat medically, no acute imaging at this time.  No prior history of preeclampsia  Preg test positive  Talk  with Dr. Elgie Congo, MAU MD.  Positive pregnancy test here.  He did inquire about imaging thus far in Ed workup.  I discussed that we have not had any imaging thus far here in the emergency department given that patient was in the still in the waiting room.  He did inquire about obtaining imaging here in the ED however upon discussion we have extended wait greater than 6 hours in the waiting room if not any beds in the back will send patient to MAU for further work-up    Henderly, Britni A, PA-C 11/03/20 2013    Sherwood Gambler, MD 11/03/20 2312

## 2020-11-03 NOTE — MAU Note (Signed)
Bladder full per ultrasound-need pt to urinate for visualization-BR OK'd per Dr.Eckstat. Pt assisted to BR

## 2020-11-03 NOTE — ED Notes (Signed)
Interpreter # [id number].

## 2020-11-03 NOTE — MAU Note (Signed)
Ultrasound at bedside

## 2020-11-03 NOTE — H&P (Signed)
FACULTY PRACTICE ANTEPARTUM ADMISSION HISTORY AND PHYSICAL NOTE   History of Present Illness: Jacqueline Watkins is a 35 y.o. [id number] at [redacted]w[redacted]d admitted for PROM.    Patient with hx of CS followed by VBAC x2 Has not yet received any prenatal care Initially presented to main ED with abdominal cramping and was transferred to MAU After arrival she was returning to the bed from the bathroom when she had a large gush of pink tinged fluid Currently endorses having intermittent crampy contraction like pain but nothing consistent No vaginal bleeding  Patient Active Problem List   Diagnosis Date Noted  . Preterm premature rupture of membranes (PPROM) with unknown onset of labor 11/03/2020  . Sepsis due to urinary tract infection ([place]) 09/11/2018  . Microcytic anemia 09/11/2018  . 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 Pyelonephritis     Past Medical History:  Diagnosis Date  . Medical history non-contributory     Past Surgical History:  Procedure Laterality Date  . CESAREAN SECTION    . NO PAST SURGERIES      OB History  Gravida Para Term Preterm AB Living  4 3 3  0 0 3  SAB IAB Ectopic Multiple Live Births  0 0 0 0 1    # Outcome Date GA Lbr Len/2nd Weight Sex Delivery Anes PTL Lv  4 Current           3 Term 06/01/11 [redacted]w[redacted]d 01:50 / 00:11 2586 g M Vag-Spont None  LIV  2 Term           1 Term             Social History   Socioeconomic History  . Marital status: Single    Spouse name: Not on file  . Number of children: Not on file  . Years of education: Not on file  . Highest education level: Not on file  Occupational History  . Not on file  Tobacco Use  . Smoking status: Never Smoker  . Smokeless tobacco: Never Used  Substance and Sexual Activity  . Alcohol use: No  . Drug use: No  . Sexual activity: Yes    Birth control/protection: Pill    Comment: plans IUD  Other Topics Concern  . Not on file  Social History Narrative  . Not on file   Social Determinants  of Health   Financial Resource Strain: Not on file  Food Insecurity: Not on file  Transportation Needs: Not on file  Physical Activity: Not on file  Stress: Not on file  Social Connections: Not on file    Family History  Problem Relation Age of Onset  . Hypertension Mother     No Known Allergies  Medications Prior to Admission  Medication Sig Dispense Refill Last Dose  . Prenatal Vit-Fe Fumarate-FA (PRENATAL VITAMINS PO) Take 1 tablet by mouth daily.   11/02/2020 at Unknown time  . acetaminophen (TYLENOL) 500 MG tablet Take 1 tablet (500 mg total) by mouth every 6 (six) hours as needed for mild pain, fever or headache. 20 tablet 0   . ibuprofen (ADVIL,MOTRIN) 800 MG tablet Take 800 mg by mouth every 4 (four) hours as needed for pain.       Pertinent positives and negative per HPI, all others reviewed and negative   Vitals:  BP 119/77 (BP Location: Right Arm)   Pulse (!) 106   Temp 98.3 F (36.8 C) (Oral)   Resp 18   Ht 5\' 1"  (1.549 m)   Wt 73.1  kg   LMP 07/15/2020   SpO2 99%   BMI 30.45 kg/m  Physical Examination: CONSTITUTIONAL: Well-developed, well-nourished female in no acute distress.  HENT:  Normocephalic, atraumatic, External right and left ear normal.  EYES: Conjunctivae and EOM are normal. Pupils are equal, round, and reactive to light. No scleral icterus.  NECK: Normal range of motion, supple, no masses SKIN: Skin is warm and dry. No rash noted. Not diaphoretic. No erythema. No pallor. [place]: Alert and oriented to person, place, and time. Normal reflexes, muscle tone coordination. No cranial nerve deficit noted. PSYCHIATRIC: Normal mood and affect. Normal behavior. Normal judgment and thought content. RESPIRATORY: comfortable on room air ABDOMEN: Soft, nontender, nondistended, gravid. GU: speculum exam notable for large gush of fluid +pool sign, unable to visualize cervix  Membranes:ruptured Fetal Monitoring:162 bpm by bedside US  Labs:  Results for  orders placed or performed during the hospital encounter of 11/03/20 (from the past 24 hour(s))  I-Stat Beta hCG blood, ED ([facility], [facility], [facility] only)   Collection Time: 11/03/20  7:54 PM  Result Value Ref Range   I-stat hCG, quantitative >2,000.0 (H) <5 mIU/mL   Comment 3            Imaging Studies: Prelim OB report:          Assessment and Plan: Patient Active Problem List   Diagnosis Date Noted  . Preterm premature rupture of membranes (PPROM) with unknown onset of labor 11/03/2020  . Sepsis due to urinary tract infection ([place]) 09/11/2018  . Microcytic anemia 09/11/2018  . 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 Pyelonephritis    Admit to Antenatal Discussed prognosis and possible outcomes with patient Reviewed options with patient including overnight observation, outpatient expectant management, and IOL.  Patient prefers overnight observation and will reassess her options in the morning   Clarnce Flock, MD/MPH Attending Family Medicine Physician, [facility] for [facility], [place]

## 2020-11-04 ENCOUNTER — Encounter (HOSPITAL_COMMUNITY): Payer: Self-pay | Admitting: Family Medicine

## 2020-11-04 DIAGNOSIS — O036 Delayed or excessive hemorrhage following complete or unspecified spontaneous abortion: Secondary | ICD-10-CM

## 2020-11-04 DIAGNOSIS — O039 Complete or unspecified spontaneous abortion without complication: Secondary | ICD-10-CM

## 2020-11-04 DIAGNOSIS — D500 Iron deficiency anemia secondary to blood loss (chronic): Secondary | ICD-10-CM

## 2020-11-04 DIAGNOSIS — Z3A16 16 weeks gestation of pregnancy: Secondary | ICD-10-CM

## 2020-11-04 DIAGNOSIS — O0932 Supervision of pregnancy with insufficient antenatal care, second trimester: Secondary | ICD-10-CM

## 2020-11-04 DIAGNOSIS — Z3A15 15 weeks gestation of pregnancy: Secondary | ICD-10-CM

## 2020-11-04 DIAGNOSIS — O4292 Full-term premature rupture of membranes, unspecified as to length of time between rupture and onset of labor: Secondary | ICD-10-CM

## 2020-11-04 DIAGNOSIS — O42012 Preterm premature rupture of membranes, onset of labor within 24 hours of rupture, second trimester: Secondary | ICD-10-CM

## 2020-11-04 DIAGNOSIS — D62 Acute posthemorrhagic anemia: Secondary | ICD-10-CM | POA: Diagnosis not present

## 2020-11-04 LAB — TYPE AND SCREEN
ABO/RH(D): O POS
Antibody Screen: NEGATIVE

## 2020-11-04 LAB — CBC
HCT: 32.7 % — ABNORMAL LOW (ref 36.0–46.0)
HCT: 27.5 % — ABNORMAL LOW (ref 36.0–46.0)
Hemoglobin: 10.5 g/dL — ABNORMAL LOW (ref 12.0–15.0)
Hemoglobin: 8.9 g/dL — ABNORMAL LOW (ref 12.0–15.0)
MCH: 24.5 pg — ABNORMAL LOW (ref 26.0–34.0)
MCH: 24.2 pg — ABNORMAL LOW (ref 26.0–34.0)
MCHC: 32.1 g/dL (ref 30.0–36.0)
MCHC: 32.4 g/dL (ref 30.0–36.0)
MCV: 76.2 fL — ABNORMAL LOW (ref 80.0–100.0)
MCV: 74.7 fL — ABNORMAL LOW (ref 80.0–100.0)
Platelets: 280 10*3/uL (ref 150–400)
Platelets: 240 10*3/uL (ref 150–400)
RBC: 4.29 MIL/uL (ref 3.87–5.11)
RBC: 3.68 MIL/uL — ABNORMAL LOW (ref 3.87–5.11)
RDW: 17.8 % — ABNORMAL HIGH (ref 11.5–15.5)
RDW: 17.4 % — ABNORMAL HIGH (ref 11.5–15.5)
WBC: 14.6 10*3/uL — ABNORMAL HIGH (ref 4.0–10.5)
WBC: 15.8 10*3/uL — ABNORMAL HIGH (ref 4.0–10.5)
nRBC: 0 % (ref 0.0–0.2)
nRBC: 0 % (ref 0.0–0.2)

## 2020-11-04 LAB — PROTIME-INR
INR: 1.1 (ref 0.8–1.2)
Prothrombin Time: 13.6 seconds (ref 11.4–15.2)

## 2020-11-04 LAB — FIBRINOGEN: Fibrinogen: 614 mg/dL — ABNORMAL HIGH (ref 210–475)

## 2020-11-04 LAB — APTT: aPTT: 29 seconds (ref 24–36)

## 2020-11-04 LAB — RESP PANEL BY RT-PCR (FLU A&B, COVID) ARPGX2
Influenza A by PCR: NEGATIVE
Influenza B by PCR: NEGATIVE
SARS Coronavirus 2 by RT PCR: NEGATIVE

## 2020-11-04 MED ORDER — IBUPROFEN 800 MG PO TABS
800.0000 mg | ORAL_TABLET | Freq: Three times a day (TID) | ORAL | 1 refills | Status: DC | PRN
Start: 2020-11-04 — End: 2020-11-22

## 2020-11-04 MED ORDER — DIBUCAINE (PERIANAL) 1 % EX OINT: 1.0000 "application " | TOPICAL_OINTMENT | CUTANEOUS | Status: DC | PRN

## 2020-11-04 MED ORDER — SIMETHICONE 80 MG PO CHEW
80.0000 mg | CHEWABLE_TABLET | ORAL | Status: DC | PRN
Start: 2020-11-04 — End: 2020-11-04

## 2020-11-04 MED ORDER — BENZOCAINE-MENTHOL 20-0.5 % EX AERO: 1.0000 "application " | INHALATION_SPRAY | CUTANEOUS | Status: DC | PRN

## 2020-11-04 MED ORDER — ZOLPIDEM TARTRATE 5 MG PO TABS: 5.0000 mg | ORAL_TABLET | Freq: Every evening | ORAL | Status: DC | PRN

## 2020-11-04 MED ORDER — ONDANSETRON HCL 4 MG PO TABS: 4.0000 mg | ORAL_TABLET | ORAL | Status: DC | PRN

## 2020-11-04 MED ORDER — PRENATAL MULTIVITAMIN CH
1.0000 | ORAL_TABLET | Freq: Every day | ORAL | Status: DC
  Administered 2020-11-04: 1 via ORAL
  Filled 2020-11-04: qty 1

## 2020-11-04 MED ORDER — ACETAMINOPHEN 325 MG PO TABS: 650.0000 mg | ORAL_TABLET | ORAL | Status: DC | PRN

## 2020-11-04 MED ORDER — OXYTOCIN-SODIUM CHLORIDE 30-0.9 UT/500ML-% IV SOLN
INTRAVENOUS | Status: AC
  Administered 2020-11-04: 333 mL/h
  Filled 2020-11-04: qty 500

## 2020-11-04 MED ORDER — FERROUS SULFATE 325 (65 FE) MG PO TABS
325.0000 mg | ORAL_TABLET | ORAL | 3 refills | Status: DC
Start: 2020-11-04 — End: 2023-02-23

## 2020-11-04 MED ORDER — COCONUT OIL OIL: 1.0000 "application " | TOPICAL_OIL | Status: DC | PRN

## 2020-11-04 MED ORDER — WITCH HAZEL-GLYCERIN EX PADS: 1.0000 "application " | MEDICATED_PAD | CUTANEOUS | Status: DC | PRN

## 2020-11-04 MED ORDER — SENNOSIDES-DOCUSATE SODIUM 8.6-50 MG PO TABS: 2.0000 | ORAL_TABLET | Freq: Every day | ORAL | Status: DC

## 2020-11-04 MED ORDER — LACTATED RINGERS IV SOLN: INTRAVENOUS | Status: DC

## 2020-11-04 MED ORDER — OXYCODONE HCL 5 MG PO TABS
5.0000 mg | ORAL_TABLET | ORAL | Status: DC | PRN
  Administered 2020-11-04: 5 mg via ORAL
  Filled 2020-11-04: qty 1

## 2020-11-04 MED ORDER — ONDANSETRON HCL 4 MG/2ML IJ SOLN: 4.0000 mg | INTRAMUSCULAR | Status: DC | PRN

## 2020-11-04 MED ORDER — IBUPROFEN 600 MG PO TABS
600.0000 mg | ORAL_TABLET | Freq: Four times a day (QID) | ORAL | Status: DC
  Administered 2020-11-04 (×2): 600 mg via ORAL
  Filled 2020-11-04 (×2): qty 1

## 2020-11-04 MED ORDER — DIPHENHYDRAMINE HCL 25 MG PO CAPS: 25.0000 mg | ORAL_CAPSULE | Freq: Four times a day (QID) | ORAL | Status: DC | PRN

## 2020-11-04 MED ORDER — OXYTOCIN-SODIUM CHLORIDE 30-0.9 UT/500ML-% IV SOLN
1.0000 m[IU]/min | INTRAVENOUS | Status: DC
  Administered 2020-11-04: 41.7 m[IU]/min via INTRAVENOUS

## 2020-11-04 MED ORDER — TETANUS-DIPHTH-ACELL PERTUSSIS 5-2.5-18.5 LF-MCG/0.5 IM SUSY: 0.5000 mL | PREFILLED_SYRINGE | Freq: Once | INTRAMUSCULAR | Status: DC

## 2020-11-04 NOTE — Discharge Summary (Signed)
Antenatal Physician Discharge Summary  Patient ID: Jacqueline Watkins MRN: [id number] DOB/AGE: 1986/07/01 35 y.o.  Admit date: 11/03/2020 Discharge date: 11/04/2020  Admission Diagnoses: Previable preterm premature rupture of membranes (PPROM) at [redacted]w[redacted]d  Discharge Diagnoses:  Completed miscarriage at [redacted] weeks gestation Previable preterm premature rupture of membranes (PPROM) delivered, current hospitalization Anemia due to acute blood loss  Prenatal Procedures: Ultrasound  Hospital Course:  Jacqueline Watkins is a 35 y.o. [phone] with IUP at [redacted]w[redacted]d admitted for observation after previable PPROM.   She was admitted with contractions and some bleeding.  She progressed to delivery of non-viable fetus around 0245 on 11/05/2019, and delivered intact placenta around 0258.  EBL 300 ml.  There were no complications.  She was observed for several hours afterwards, there were no issues noted. Hemoglobin was 8.9 (down from 10.5 on admission), she was asymptomatic, and she was started on oral iron for this acute blood loss anemia.  Appropriate support given to them. Patient declined SW and Chaplain consults.  She was deemed stable for discharge to home with close outpatient follow up.  Discharge Exam: Temp:  [98 F (36.7 C)-98.6 F (37 C)] 98.3 F (36.8 C) (03/11 0802) Pulse Rate:  [87-106] 92 (03/11 0802) Resp:  [16-18] 16 (03/11 0530) BP: (95-122)/(53-77) 99/60 (03/11 0802) SpO2:  [99 %-100 %] 100 % (03/11 0802) Weight:  [73.1 kg] 73.1 kg (03/10 1941) Physical Examination: CONSTITUTIONAL: Well-developed, well-nourished female in no acute distress.  HENT:  Normocephalic, atraumatic, External right and left ear normal.  EYES: Conjunctivae and EOM are normal. Pupils are equal, round, and reactive to light. No scleral icterus.  NECK: Normal range of motion, supple, no masses SKIN: Skin is warm and dry. No rash noted. Not diaphoretic. No erythema. No pallor. [place]: Alert and oriented to person,  place, and time. Normal reflexes, muscle tone coordination. No cranial nerve deficit noted. PSYCHIATRIC: Normal mood and affect. Normal behavior. Normal judgment and thought content. CARDIOVASCULAR: Normal heart rate noted, regular rhythm RESPIRATORY: Effort and breath sounds normal, no problems with respiration noted MUSCULOSKELETAL: Normal range of motion. No edema and no tenderness. 2+ distal pulses. ABDOMEN: Soft, nontender, nondistended, gravid. PELVIC:  Deferred, patient reported minimal bleeding  Significant Diagnostic Studies:  Results for orders placed or performed during the hospital encounter of 11/03/20 (from the past 168 hour(s))  I-Stat Beta hCG blood, ED ([facility], [facility], [facility] only)   Collection Time: 11/03/20  7:54 PM  Result Value Ref Range   I-stat hCG, quantitative >2,000.0 (H) <5 mIU/mL   Comment 3          Type and screen [place]   Collection Time: 11/03/20 10:50 PM  Result Value Ref Range   ABO/RH(D) O POS    Antibody Screen NEG    Sample Expiration      11/06/2020,2359 Performed at [facility], [place] [address], [place], [place] [address]   Resp Panel by RT-PCR (Flu A&B, Covid) Nasopharyngeal Swab   Collection Time: 11/03/20 11:00 PM   Specimen: Nasopharyngeal Swab; Nasopharyngeal(NP) swabs in vial transport medium  Result Value Ref Range   SARS Coronavirus 2 by RT PCR NEGATIVE NEGATIVE   Influenza A by PCR NEGATIVE NEGATIVE   Influenza B by PCR NEGATIVE NEGATIVE  CBC on admission   Collection Time: 11/03/20 11:06 PM  Result Value Ref Range   WBC 14.6 (H) 4.0 - 10.5 K/uL   RBC 4.29 3.87 - 5.11 MIL/uL   Hemoglobin 10.5 (L) 12.0 - 15.0 g/dL  HCT 32.7 (L) 36.0 - 46.0 %   MCV 76.2 (L) 80.0 - 100.0 fL   MCH 24.5 (L) 26.0 - 34.0 pg   MCHC 32.1 30.0 - 36.0 g/dL   RDW 17.8 (H) 11.5 - 15.5 %   Platelets 280 150 - 400 K/uL   nRBC 0.0 0.0 - 0.2 %  APTT   Collection Time: 11/03/20 11:06 PM  Result Value Ref Range   aPTT 29 24 - 36 seconds   Protime-INR   Collection Time: 11/03/20 11:06 PM  Result Value Ref Range   Prothrombin Time 13.6 11.4 - 15.2 seconds   INR 1.1 0.8 - 1.2  Fibrinogen   Collection Time: 11/03/20 11:06 PM  Result Value Ref Range   Fibrinogen 614 (H) 210 - 475 mg/dL  CBC   Collection Time: 11/04/20  4:55 AM  Result Value Ref Range   WBC 15.8 (H) 4.0 - 10.5 K/uL   RBC 3.68 (L) 3.87 - 5.11 MIL/uL   Hemoglobin 8.9 (L) 12.0 - 15.0 g/dL   HCT 27.5 (L) 36.0 - 46.0 %   MCV 74.7 (L) 80.0 - 100.0 fL   MCH 24.2 (L) 26.0 - 34.0 pg   MCHC 32.4 30.0 - 36.0 g/dL   RDW 17.4 (H) 11.5 - 15.5 %   Platelets 240 150 - 400 K/uL   nRBC 0.0 0.0 - 0.2 %   No results found.  No future appointments.  Discharge Condition: Stable  Discharge disposition: 01-Home or Self Care        Allergies as of 11/04/2020   No Known Allergies     Medication List    STOP taking these medications   PRENATAL VITAMINS PO     TAKE these medications   acetaminophen 500 MG tablet Commonly known as: TYLENOL Take 1 tablet (500 mg total) by mouth every 6 (six) hours as needed for mild pain, fever or headache.   ferrous sulfate 325 (65 FE) MG tablet Commonly known as: FerrouSul Take 1 tablet (325 mg total) by mouth every other day.   ibuprofen 800 MG tablet Commonly known as: ADVIL Take 1 tablet (800 mg total) by mouth 3 (three) times daily with meals as needed. What changed:   when to take this  reasons to take this       [place] for [place] at [facility] Follow up in 1 week(s).   Specialty: Obstetrics and Gynecology Why: You will be called with appointment details. Contact information: [place] [phone]              Total discharge time: 25 minutes   Signed: Verita Schneiders M.D. 11/04/2020, 10:16 AM

## 2020-11-04 NOTE — Progress Notes (Signed)
Pt discharged after discharge instructions given utilizing interpreter Roderic Palau [phone]. All questions answered. Iv discontinued. Pt discharged in stable condition and sent with all belongings. Pt chose to take home fetal remains with her in casket.

## 2020-11-04 NOTE — Discharge Instructions (Signed)
Como sobrellevar la prdida de Nutritional therapist Managing Pregnancy Loss La prdida del Control and instrumentation engineer ocurrir en cualquier momento durante un Media planner. Generalmente no se conoce la causa. Rara vez se debe a algo que usted hizo. La prdida del Media planner a comienzos del Media planner (Forensic scientist trimestre) se denomina aborto espontneo. Este es el tipo de prdida de un embarazo ms frecuente. La prdida del embarazo que ocurre despus de las 20 semanas de embarazo se denomina muerte fetal si el corazn del beb deja de latir antes del nacimiento. La muerte fetal es mucho menos frecuente. Algunas mujeres experimentan trabajo de parto espontneo poco despus de la muerte fetal, lo que ocasiona el nacimiento del beb muerto. Cualquier prdida de Nutritional therapist puede ser devastadora. Tendr que recuperarse fsica y emocionalmente. La mayora de las mujeres son capaces de quedar embarazadas nuevamente despus de la prdida de un Media planner y dar a luz un beb sano. Cmo manejar la recuperacin emocional La prdida de un embarazo es muy difcil emocionalmente. Es posible que sienta muchas emociones distintas mientras hace el duelo. Puede sentirse triste y [place]. Tambin puede sentir culpa. Es normal tener perodos de llanto. La recuperacin emocional puede llevar ms tiempo que la recuperacin fsica. Es diferente para Higher education careers adviser. Estas medidas pueden ayudarla a sobrellevar esta prdida:  Recuerde que es poco probable que haya hecho algo para causar la prdida del Media planner.  Comparta sus pensamientos y sentimientos con su pareja, familiares y Biomedical engineer. Recuerde que su pareja tambin se est recuperando emocionalmente.  Asegrese de tener un buen sistema de apoyo. No pase demasiado tiempo sola.  Renase con un consejero sobre prdida de [place] o nase a un grupo de apoyo para prdida de Building control surveyor.  Duerma lo suficiente y siga una dieta sana. Regrese a la actividad fsica con regularidad cuando se haya recuperado  fsicamente.  No consuma drogas ni alcohol para controlar sus emociones.  Considere consultar a un profesional de salud mental para que la ayude a Chief Strategy Officer.  Pdale a un amigo o un ser querido que la ayude a decidir qu hacer con la ropa y los artculos infantiles que recibi para el beb. En el caso de muerte fetal, muchas mujeres se benefician al tomar [place] en el proceso del duelo. Es recomendable que:  Sostenga al beb despus del nacimiento.  Le ponga un nombre al beb.  Solicite un certificado de nacimiento.  Cree un recuerdo, Dana los pies.  Vista al beb y pida que le tomen una fotografa.  Haga arreglos para Furniture conservator/restorer.  Solicite un bautismo o una bendicin. Los hospitales tienen personal que puede ayudarla a Programme researcher, broadcasting/film/video todo esto.   Cmo reconocer el estrs emocional Es normal tener estrs emocional despus de perder un Media planner. Pero el estrs emocional que dura mucho tiempo o se vuelve grave requiere tratamiento. Tenga cuidado con estos signos de estrs emocional grave:  Tristeza, ira o culpa, que no desaparecen y estn interfiriendo en sus actividades normales.  Problemas de relacin que hayan surgido o empeorado desde la prdida del Media planner.  Signos de depresin que duran ms de 2 semanas. Pueden incluir: ? Tristeza. ? Ansiedad. ? Desesperanza. ? Falta de inters en las actividades que disfruta. ? Incapacidad para concentrarse. ? Dificultad para dormir o dormir demasiado. ? Prdida del apetito o comer en exceso. ? Pensamientos sobre la muerte o de [place] dao a s misma. Siga estas instrucciones en su casa:  Use los medicamentos de venta  libre y los TEFL teacher se lo haya indicado el mdico.  Haga reposo en su casa hasta que su nivel de energa regrese. Reanude sus actividades normales segn lo indicado por el mdico. Pregntele al mdico qu actividades son seguras para  usted.  Cuando est lista, visite a su mdico para hablar [employer] debe tomar para futuros embarazos.  Concurra a todas las visitas de seguimiento como se lo haya indicado el mdico. Esto es importante. Dnde encontrar 66  Para que usted y su pareja puedan sobrellevar el proceso del duelo, hable con su mdico o busque apoyo psicolgico.  Considere la posibilidad de reunirse con otras mujeres que hayan sufrido la prdida de Nutritional therapist. Consulte al [employer] distintos grupos de 71 y recursos. Dnde buscar ms informacin  U.S. Department of Health and [employer], Office on Women's Health ([place] y [place], [place] de la Mujer): VirginiaBeachSigns.tn  American Pregnancy Association (Asociacin Americana del [place]): CulturalLocator.com.cy. Comunquese con un mdico si:  Contina sintiendo pena, tristeza o falta de motivacin para realizar las actividades diarias, y estos sentimientos no mejoran con Physiological scientist.  Tiene problemas para recuperarse emocionalmente, en especial si est consumiendo alcohol o sustancias para ayudar. Solicite ayuda inmediatamente si:  Piensa en lastimarse a usted misma o a Producer, television/film/video. Si alguna vez siente que puede lastimarse a usted misma o Market researcher a Producer, television/film/video, o tiene pensamientos de poner fin a su vida, busque ayuda de inmediato. Puede dirigirse al servicio de emergencias ms cercano o comunicarse con:  El servicio de emergencias de su localidad (911 en EE.[place].).  Una lnea de asistencia al suicida y Freight forwarder en crisis, como National Suicide Prevention Lifeline ([place]), al (626)457-3511. Est disponible las 24 horas del da. Resumen  Cualquier prdida de Nutritional therapist puede ser difcil fsica y emocionalmente.  Es posible que tenga muchas emociones distintas mientras hace el duelo. La recuperacin emocional puede durar ms tiempo  que la recuperacin fsica.  Es normal tener estrs emocional despus de perder Nutritional therapist. Pero el estrs emocional que dura mucho tiempo o se vuelve grave requiere tratamiento.  Consulte a su mdico si tiene problemas emocionales despus de la prdida de Nutritional therapist. Esta informacin no tiene Marine scientist el consejo del mdico. Asegrese de hacerle al mdico cualquier pregunta que tenga. Document Revised: 12/08/2018 Document Reviewed: 11/23/2017 Elsevier Patient Education  2021 [place] Miscarriage El aborto espontneo es la prdida de un embarazo antes de la [id number]. La mayor parte de los abortos espontneos ocurre durante los primeros 46meses de Media planner. A veces, un aborto ocurre antes de que la mujer sepa que est [place]. El aborto espontneo puede ser Ardelia Mems experiencia que afecte emocionalmente a Geologist, engineering. Si ha sufrido un aborto espontneo, hable con el mdico y hgale las preguntas que tenga sobre la prdida del beb, el proceso de duelo y los planes futuros de [place]. Cules son las causas? Muchas veces la causa del aborto espontneo no se conoce. Qu incrementa el riesgo? Los siguientes factores pueden hacer que una embarazada sea ms propensa a sufrir un aborto espontneo: Ciertas enfermedades crnicas  Enfermedades que afectan al equilibrio hormonal del cuerpo, como una enfermedad tiroidea o sndrome del ovario poliqustico.  Diabetes.  Trastornos autoinmunitarios.  Infecciones.  Trastornos hemorrgicos.  Obesidad. Factores de estilo de vida  Consumir productos con tabaco o nicotina o estar expuesta al humo  del tabaco.  Beber alcohol.  Consumir grandes cantidades de cafena.  Consumo de drogas. Problemas relacionados con las estructuras o los rganos genitales  Insuficiencia cervical. Esto es cuando la parte ms baja del tero (cuello uterino) se abre y se hace ms delgada antes de que el embarazo llegue a  trmino.  Padecer una afeccin llamada sndrome de [place]. Este sndrome causa la formacin de cicatrices en el tero o hace que el tero tenga una estructura anormal.  Crecimientos fibrosos, llamados fibromas, en el tero.  Anormalidades congnitas. Estos son problemas que ya estn presentes en el nacimiento.  Infeccin en el cuello del tero. Antecedentes personales o mdicos  Lesiones (traumatismos).  Haber tenido un aborto espontneo antes.  Ser menor de 18aos o mayor de 35aos de edad.  Exposicin a sustancias nocivas del medio ambiente. Esto puede incluir radiacin o metales pesados, como el plomo.  Uso de ciertos medicamentos. Cules son los signos o sntomas? Los sntomas de esta afeccin incluyen:  Sangrado o manchado vaginal, con o sin clicos o dolor.  Dolor o clicos en el abdomen o en la parte inferior de la espalda.  Salida de lquido o tejido por la vagina. Cmo se diagnostica? Esta afeccin se puede diagnosticar en funcin de lo siguiente:  Un examen fsico.  Una ecografa.  Pruebas de laboratorio, como anlisis de [place], [place] de [place] o hisopados para Hydrographic surveyor una infeccin. Cmo se trata? A veces no es necesario dar tratamiento a un aborto espontneo si todo el tejido fetal que estaba en el tero sale por s solo y no hay otros problemas como infeccin o sangrado abundante. En otros casos, esta afeccin puede tratarse con lo siguiente:  Dilatacin y curetaje (D y C). En este procedimiento, se expande el cuello uterino y se extrae todo resto de tejido fetal del recubrimiento del tero (endometrio).  Medicamentos. Pueden incluir: ? Antibiticos para tratar una infeccin. ? Medicamentos para expulsar los restos de tejido fetal del cuerpo. ? Medicamentos para reducir Occupational psychologist) el tamao del tero. Estos medicamentos se pueden administrar si hay un sangrado abundante. Si tiene sangre Rh negativa, se le puede administrar una inyeccin de un  medicamento llamado inmunoglobulina Rho(D). Este medicamento ayuda a Warden/ranger en futuros embarazos. Siga estas instrucciones en su casa: Medicamentos  Use los medicamentos de venta libre y los recetados solamente como se lo haya indicado el mdico.  Si le recetaron antibiticos, tmelos como se lo haya indicado el mdico. No deje de tomar el antibitico aunque comience a sentirse mejor. Actividad  Haga reposo como se lo haya indicado el mdico. Pregntele al mdico qu actividades son seguras para usted.  Pdale a alguien que la ayude con las responsabilidades familiares y del hogar durante este tiempo. Instrucciones generales  Est atenta a la cantidad de tejido o cogulos de [employer] de la vagina.  No tenga relaciones sexuales, no se haga duchas vaginales ni se introduzca nada, como tampones, en la vagina hasta que el mdico la autorice.  Para que usted y su pareja puedan sobrellevar el proceso del duelo, hable con su mdico o [place] apoyo psicolgico.  Cuando est lista, visite a su mdico para hablar sobre los pasos importantes que deber seguir en relacin con su salud. Tambin hable [employer] que deber tomar para tener un embarazo saludable en el futuro.  Cumpla con todas las visitas de seguimiento. Esto es importante.   Dnde buscar ms informacin  The [employer] of Obstetricians and Gynecologists (Colegio [employer]  de Obstetras y [place]): acog.org  U.S. Department of Health and [employer], Office on [employer] Health ([place] y [place] los [place], [place]): EverydayCosmetics.no Comunquese con un mdico si:  Jaclynn Guarneri o escalofros.  Le sale lquido con mal olor de la vagina.  El sangrado aumenta en vez de disminuir.  Le salen cogulos de sangre o tejido de la vagina. Solicite ayuda de inmediato si:  Siente calambres intensos o dolor en la espalda o en el  abdomen.  Tiene un sangrado abundante que llena 2apsitos sanitarios grandes por hora durante ms de 2horas.  Se siente mareada o dbil.  Se desmaya.  Se siente triste, y esa tristeza se apodera de sus pensamientos.  Piensa en hacerse dao. Si alguna vez siente que puede lastimarse o Market researcher a Producer, television/film/video, o tiene pensamientos de poner fin a su vida, busque ayuda de inmediato. Dirjase al servicio de urgencias ms cercano o:  Comunquese con el servicio de emergencias de su localidad (911 en los [place]).  Llame a una lnea de asistencia al suicida y atencin en crisis como National Suicide Prevention Lifeline ([place]) al [phone]. Est disponible las 24 horas del da en los [place].  Enve un mensaje de texto a la lnea para casos de crisis al [phone] (en los EE.UU.). Resumen  La mayor parte de los abortos espontneos ocurre durante los primeros 62meses de Media planner. En algunos casos, el aborto espontneo ocurre antes de que la mujer sepa que est [place].  Siga las instrucciones del mdico acerca de los medicamentos y la [place].  Para que usted y su pareja puedan sobrellevar el duelo, hable con su mdico o consiga apoyo psicolgico.  Cumpla con todas las visitas de seguimiento. Esta informacin no tiene Marine scientist el consejo del mdico. Asegrese de hacerle al mdico cualquier pregunta que tenga. Document Revised: 03/16/2020 Document Reviewed: 03/16/2020 Elsevier Patient Education  [place].

## 2020-11-04 NOTE — Plan of Care (Signed)
  Problem: Education: Goal: Knowledge of disease or condition will improve Outcome: Completed/Met Goal: Knowledge of the prescribed therapeutic regimen will improve Outcome: Completed/Met Goal: Individualized Educational Video(s) Outcome: Completed/Met   Problem: Clinical Measurements: Goal: Complications related to the disease process, condition or treatment will be avoided or minimized Outcome: Completed/Met   Problem: Coping: Goal: Ability to identify and utilize available resources and services will improve Outcome: Completed/Met

## 2020-11-04 NOTE — Progress Notes (Signed)
Chaplain, accompanied by [place] interpreter, introduced spiritual care services and offered grief support to [place] after the early delivery and demise of her son. Chaplain asked guided questions about Nadie's experience and her support system. Chaplain provided education on typical grief responses, normalizing varying experiences, and support available in the community. Chaplain encouraged sharing of feelings and offered emotional support. Pt reports she has good support in the community and is aware of chaplain's continued availability after discharge.   Please page as further needs arise.  Donald Prose. Elyn Peers, M.Div. [facility] Chaplain Pager [phone] Office [phone]

## 2020-11-07 LAB — SURGICAL PATHOLOGY

## 2020-11-18 NOTE — BH Specialist Note (Signed)
Integrated Behavioral Health Initial In-Person Visit  MRN: [id number] Name: Jacqueline Watkins  Number of [place] Clinician visits:: 1/6 Session Start time: 9:24  Session End time: 9:39 Total time: 15 minutes  Types of Service: Individual psychotherapy  Interpretor:Yes.   Interpretor Name and Language: Donald Prose [id number], Spanish   Warm Hand Off Completed.       Subjective: Jacqueline Watkins is a 35 y.o. female accompanied by n/a Patient was referred by Clayton Lefort, MD for mood check after loss. Patient reports the following symptoms/concerns: Pt states she is feeling well mood-wise, but low energy; primary concern today is burning when she urinates and pain in her left side and back.  Duration of problem: Postpartum; Severity of problem: moderate  Objective: Mood: Normal and Affect: Appropriate Risk of harm to self or others: No plan to harm self or others  Life Context: Family and Social: - School/Work: - Self-Care: - Life Changes: Recent loss at [place] gestation  Patient and/or Family's Strengths/Protective Factors: Physical Health (exercise, healthy diet, medication compliance, etc.)  Goals Addressed: Patient will: 1. Increase knowledge and/or ability of: healthy habits  2. Demonstrate ability to: Increase healthy adjustment to current life circumstances  Progress towards Goals: Ongoing  Interventions: Interventions utilized: Supportive Counseling  Standardized Assessments completed: GAD-7 and PHQ 9  Patient and/or Family Response: Pt agrees to treatment plan  Patient Centered Plan: Patient is on the following Treatment Plan(s):  IBH  Assessment: Patient currently experiencing Grief.   Patient may benefit from supportive counseling today.  Plan: 1. Follow up with behavioral health clinician on : Call Astin Rape at [phone] as needed for any follow up visits 2. Behavioral recommendations:  -Continue taking iron pills, as  prescribed -Set up MyChart, as discussed (code sent via text; contact MyChart Help desk as needed) -Consider [place] Perinatal support group online, as discussed, as needed, for additional support -Agree to nurse visit today to address medical concerns -Agree to have [place] call in one week for additional mood check 3. Referral(s): [facility] [employer] [facility] (In Clinic)  [place], [place]  Depression screen [facility] 2/9 11/29/2020 11/22/2020  Decreased Interest 0 0  Down, Depressed, Hopeless 0 0  PHQ - 2 Score 0 0  Altered sleeping 0 1  Tired, decreased energy 1 1  Change in appetite 0 2  Feeling bad or failure about yourself  0 0  Trouble concentrating 0 0  Moving slowly or fidgety/restless 0 0  Suicidal thoughts 0 0  PHQ-9 Score 1 4   GAD 7 : Generalized Anxiety Score 11/29/2020 11/22/2020  Nervous, Anxious, on Edge 0 1  Control/stop worrying 0 1  Worry too much - different things 0 0  Trouble relaxing 0 1  Restless 1 1  Easily annoyed or irritable 0 0  Afraid - awful might happen 0 0  Total GAD 7 Score 1 4

## 2020-11-22 ENCOUNTER — Encounter: Payer: Self-pay | Admitting: Family Medicine

## 2020-11-22 ENCOUNTER — Other Ambulatory Visit: Payer: Self-pay

## 2020-11-22 ENCOUNTER — Ambulatory Visit (INDEPENDENT_AMBULATORY_CARE_PROVIDER_SITE_OTHER): Payer: Self-pay | Admitting: Family Medicine

## 2020-11-22 DIAGNOSIS — O039 Complete or unspecified spontaneous abortion without complication: Secondary | ICD-10-CM

## 2020-11-22 NOTE — Progress Notes (Signed)
GYNECOLOGY OFFICE VISIT NOTE  History:   Jacqueline Watkins is a 35 y.o. [phone] here today for follow up of SAB.  Patient seen by this author in MAU on 11/03/2020 At that time had come for abdominal pain and had PPROM while walking back from the bathroom Admitted over night and had SVD on 11/04/2020 of previable infant at [redacted]w[redacted]d gestation  Today reports mild intermittent cramping Occasional vaginal spotting, no heavy bleeding or clots Emotionally doing better than when she was in the hospital Curious about why this might have happened Does not desire any more children, would like to get birth control  Past Medical History:  Diagnosis Date  . 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 Microcytic anemia 09/11/2018  . Pyelonephritis   . Sepsis due to urinary tract infection ([place]) 09/11/2018    Past Surgical History:  Procedure Laterality Date  . CESAREAN SECTION      The following portions of the patient's history were reviewed and updated as appropriate: allergies, current medications, past family history, past medical history, past social history, past surgical history and problem list.   Review of Systems:  Pertinent items noted in HPI and remainder of comprehensive ROS otherwise negative.  Physical Exam:  BP 115/77   Pulse 90   Ht 4\' 11"  (1.499 m)   Wt 151 lb (68.5 kg)   LMP 07/15/2020   Breastfeeding No   BMI 30.50 kg/m  CONSTITUTIONAL: Well-developed, well-nourished female in no acute distress.  HEENT:  Normocephalic, atraumatic. External right and left ear normal. No scleral icterus.  NECK: Normal range of motion, supple, no masses noted on observation SKIN: No rash noted. Not diaphoretic. No erythema. No pallor. MUSCULOSKELETAL: Normal range of motion. No edema noted. NEUROLOGIC: Alert and oriented to person, place, and time. Normal muscle tone coordination.  PSYCHIATRIC: Normal mood and affect. Normal behavior. Normal judgment and thought content. RESPIRATORY:  Effort normal, no problems with respiration noted  Labs and Imaging No results found for this or any previous visit (from the past 168 hour(s)). [place] MFM OB COMP + 14 WK  Result Date: 11/04/2020 ----------------------------------------------------------------------  OBSTETRICS REPORT                       (Signed Final 11/04/2020 10:27 am) ---------------------------------------------------------------------- Patient Info  ID #:       [id number]                          D.O.B.:  08/30/1985 (34 yrs)  Name:       Jacqueline GUMBS-               Visit Date: 11/03/2020 10:03 pm              Jacqueline Watkins ---------------------------------------------------------------------- Performed By  Attending:        Tama High MD        Ref. Address:     [address]  [place], [place]  Performed By:     Berlinda Last          Secondary Phy.:   [facility] MAU/Triage                    RDMS  Referred By:      Annice Needy              [facility]                    Julie-Anne Torain MD                               [facility] ---------------------------------------------------------------------- Orders  #  Description                           Code        Ordered By  1  [place] MFM OB COMP + 14 WK                76805.01    Nikol Lemar ----------------------------------------------------------------------  #  Order #                     Accession #                Episode #  1  [id number]                   [id number]                 [id number] ---------------------------------------------------------------------- Indications  Premature rupture of membranes - leaking       O42.90  fluid  [redacted] weeks gestation of pregnancy                Z3A.15  Late prenatal care, second trimester           O09.32  ---------------------------------------------------------------------- Fetal Evaluation  Num Of Fetuses:         1  Fetal Heart Rate(bpm):  167  Cardiac Activity:       Observed  Presentation:           Cephalic  Placenta:               Posterior  P. Cord Insertion:      Visualized  Amniotic Fluid  AFI FV:      Within normal limits                              Largest Pocket(cm)                              3  Comment:    subchorionic hemorrhage noted 2.9 cm. ---------------------------------------------------------------------- Biometry  BPD:      34.6  mm     G. Age:  16w 5d         83  %  CI:        73.33   %    70 - 86                                                          FL/HC:      15.3   %    13.3 - 16.5  HC:      128.4  mm     G. Age:  16w 4d         73  %    HC/AC:      1.16        1.05 - 1.39  AC:      110.5  mm     G. Age:  16w 6d         84  %    FL/BPD:     56.9   %  FL:       19.7  mm     G. Age:  15w 6d         45  %    FL/AC:      17.8   %    20 - 24  HUM:      19.8  mm     G. Age:  15w 6d         53  %  Est. FW:     156  gm      0 lb 6 oz     79  % ---------------------------------------------------------------------- OB History  Gravidity:    4         Term:   3        Prem:   0        SAB:   0  TOP:          0       Ectopic:  0        Living: 3 ---------------------------------------------------------------------- Gestational Age  LMP:           15w 6d        Date:  07/15/20                 EDD:   04/21/21  U/S Today:     16w 4d                                        EDD:   04/16/21  Best:          15w 6d     Det. By:  LMP  (07/15/20)          EDD:   04/21/21 ---------------------------------------------------------------------- Anatomy  Cranium:               Appears normal         Bladder:                Appears normal  Stomach:               Appears normal, left   Upper Extremities:      Visualized                         sided  Cord Vessels:  Appears normal (3      Lower  Extremities:      Visualized                         vessel cord)  Kidneys:               Appear normal  Other:  Technically difficult due to fetal position. Technically difficult due to          early gestational age. ---------------------------------------------------------------------- Cervix Uterus Adnexa  Cervix  Length:            3.4  cm.  Normal appearance by transabdominal scan. ---------------------------------------------------------------------- Impression  Patient initially presented with abdominal cramping and later  had preterm premature rupture of membranes.  On ultrasound, fetal biometry was consistent with her  previously established dates.  Amniotic fluid was normal.  Fetal anatomy is very limited because of early gestational  age.  Subchorionic hemorrhage measuring 2.9 cm was seen. ----------------------------------------------------------------------                  Tama High, MD Electronically Signed Final Report   11/04/2020 10:27 am ----------------------------------------------------------------------     Assessment and Plan:   Problem List Items Addressed This Visit      Other   Complete miscarriage at [redacted] weeks gestation    Reviewed pathology showing acute chorioamnionitis. Per history patient likely developed chorio leading to preterm labor and PPROM. Discussed that this was not predictable. Doing well physically and emotionally. Interested in [place], will consider Nexplanon vs IUD at [facility]. Declines interval contraception. Rh positive blood type.          Routine preventative health maintenance measures emphasized. Please refer to After Visit Summary for other counseling recommendations.   Return if symptoms worsen or fail to improve.    Total face-to-face time with patient: 15 minutes.  Over 50% of encounter was spent on counseling and coordination of care.   Clarnce Flock, MD/[facility] [employer], [place]

## 2020-11-22 NOTE — Assessment & Plan Note (Addendum)
Reviewed pathology showing acute chorioamnionitis. Per history patient likely developed chorio leading to preterm labor and PPROM. Discussed that this was not predictable. Doing well physically and emotionally. Interested in [place], will consider Nexplanon vs IUD at [facility]. Declines interval contraception. Rh positive blood type.

## 2020-11-29 ENCOUNTER — Ambulatory Visit (INDEPENDENT_AMBULATORY_CARE_PROVIDER_SITE_OTHER): Payer: Self-pay

## 2020-11-29 ENCOUNTER — Ambulatory Visit: Payer: Self-pay | Admitting: Clinical

## 2020-11-29 VITALS — BP 118/80 | HR 84 | Wt 154.2 lb

## 2020-11-29 DIAGNOSIS — F4321 Adjustment disorder with depressed mood: Secondary | ICD-10-CM

## 2020-11-29 DIAGNOSIS — R300 Dysuria: Secondary | ICD-10-CM

## 2020-11-29 LAB — POCT URINALYSIS DIP (DEVICE)
Bilirubin Urine: NEGATIVE
Glucose, UA: NEGATIVE mg/dL
Ketones, ur: NEGATIVE mg/dL
Nitrite: NEGATIVE
Protein, ur: NEGATIVE mg/dL
Specific Gravity, Urine: 1.02 (ref 1.005–1.030)
Urobilinogen, UA: 0.2 mg/dL (ref 0.0–1.0)
pH: 6.5 (ref 5.0–8.0)

## 2020-11-29 MED ORDER — NITROFURANTOIN MONOHYD MACRO 100 MG PO CAPS: 100.0000 mg | ORAL_CAPSULE | Freq: Two times a day (BID) | ORAL | 0 refills | Status: AC

## 2020-11-29 NOTE — Progress Notes (Signed)
Pt here today for urinary burning, urinary urgency, frequency  and left side back pain x 4 days. Pt states has hx of kidney stones and UTI. Pt denies any vaginal bleeding, cramps. Pt states having pain as 4-5 on Saturday, but better today. Pt denies fever.   UA performed with + leukocytes and moderate blood.   Pt advised will treat for UTI today with Macrobid BID x 5 days, but instructed to call if sx do no resolve completely or pain increases. Pt advised to be seen in ER for possible imaging if sx not resolved with abx. Pt advised will send urine for culture today.   Pt advised will receive results from urine culture in 3-4 days via mychart and will be contacted if any further treatment needed.  Pt verbalized understanding and agreeable to plan of care. Raquel, interpreted for pt.   Colletta Maryland, RN BSN 11/29/20

## 2020-11-29 NOTE — Progress Notes (Signed)
Chart reviewed for nurse visit. Agree with plan of care.   Clarnce Flock, MD 11/29/20 12:08 PM

## 2020-11-29 NOTE — Patient Instructions (Signed)
[facility] [address] [place], [place] [address] [phone] (main office) [phone] (Akayla Brass's office)   

## 2020-12-01 ENCOUNTER — Telehealth: Payer: Self-pay

## 2020-12-01 LAB — URINE CULTURE: Organism ID, Bacteria: NO GROWTH

## 2020-12-01 NOTE — Telephone Encounter (Signed)
Spoke with triage nurse from [facility] who states there is a resident clinic on Thursdays at no cost to patients. Appt scheduled for 12/08/20 at 1330; pt to arrive at 1315 and to bring $15 for possible labs. Urology nurse recommends ibuprofen for pain, increased fluids, and straining urine for stones until appt. Pt may call their office for earlier appt but will be charged $250 to see urologist, residents only available on Thursday. Called pt with interpreter Eda, pt agreeable to appt.

## 2020-12-01 NOTE — Telephone Encounter (Addendum)
-----   Message from Clarnce Flock, MD sent at 12/01/2020 10:14 AM EDT ----- No evidence of UTI, please call patient and tell her to stop antibiotics. May need to come back and do vaginal swab to rule out other etiologies like yeast.    Called pt with interpreter Eda Royal; results given. Pt reports she believes she has kidney stones; reports passing stones in her urine. Reviewed with Ilda Basset, MD who recommends referral to urology. Recommends pt increase fluid intake; water with lemon encouraged. Pt to present to ED with any fever, chills, nausea, or vomiting. Pt will await call from [facility]. External referral completed.

## 2020-12-15 ENCOUNTER — Other Ambulatory Visit (HOSPITAL_COMMUNITY): Payer: Self-pay | Admitting: Urology

## 2020-12-15 DIAGNOSIS — R1084 Generalized abdominal pain: Secondary | ICD-10-CM

## 2020-12-20 ENCOUNTER — Encounter (HOSPITAL_COMMUNITY): Payer: Self-pay

## 2020-12-20 ENCOUNTER — Ambulatory Visit (HOSPITAL_COMMUNITY): Payer: Self-pay

## 2020-12-26 ENCOUNTER — Other Ambulatory Visit: Payer: Self-pay

## 2020-12-26 ENCOUNTER — Ambulatory Visit (HOSPITAL_COMMUNITY)
Admission: RE | Admit: 2020-12-26 | Discharge: 2020-12-26 | Disposition: A | Discharge status: Home / Self Care | Payer: Self-pay | Source: Ambulatory Visit | Attending: Urology | Admitting: Urology

## 2020-12-26 DIAGNOSIS — R1084 Generalized abdominal pain: Secondary | ICD-10-CM | POA: Insufficient documentation

## 2021-01-11 ENCOUNTER — Encounter: Payer: Self-pay | Admitting: Gastroenterology

## 2021-02-02 ENCOUNTER — Ambulatory Visit: Payer: Self-pay | Admitting: Gastroenterology

## 2021-02-02 ENCOUNTER — Encounter: Payer: Self-pay | Admitting: Gastroenterology

## 2021-02-02 VITALS — BP 100/60 | HR 83 | Ht 59.75 in | Wt 155.0 lb

## 2021-02-02 DIAGNOSIS — R935 Abnormal findings on diagnostic imaging of other abdominal regions, including retroperitoneum: Secondary | ICD-10-CM

## 2021-02-02 DIAGNOSIS — K5900 Constipation, unspecified: Secondary | ICD-10-CM

## 2021-02-02 DIAGNOSIS — R933 Abnormal findings on diagnostic imaging of other parts of digestive tract: Secondary | ICD-10-CM | POA: Insufficient documentation

## 2021-02-02 DIAGNOSIS — R1032 Left lower quadrant pain: Secondary | ICD-10-CM

## 2021-02-02 HISTORY — DX: Constipation, unspecified: K59.00

## 2021-02-02 NOTE — Progress Notes (Signed)
Reviewed and agree with management plan.  Sedonia Kitner T. Tamilyn Lupien, MD FACG [phone]  

## 2021-02-02 NOTE — Progress Notes (Signed)
02/02/2021 Jacqueline Watkins [id number] 1986-02-03   HISTORY OF PRESENT ILLNESS:  this is a 35 year old female who is new to our office.  She is here today at the request of Dr. Louis Meckel regarding abnormal finding on CT scan.  She says that she had a CT scan of the abdomen and pelvis W/O contrast because she was seeing crystals in her urine.  CT scan showed the following:  IMPRESSION: Bilateral medullary nephrocalcinosis. No evidence of ureteral calculi or hydronephrosis.   3.8 cm intraluminal polypoid mass in ascending colon, highly suspicious for villous adenoma or colon carcinoma. Colonoscopy is recommended for further evaluation.   Cholelithiasis. No radiographic evidence of cholecystitis.   She says that she has been having LLQ abdominal pain for the past few months.  Reports constipation as well.  Says that if she eats enough fruits and vegetables then she does ok.  Sometimes she has to use a laxative.  Denies seeing blood in her stool.  No family history of colon cancer to her knowledge.  The entire visit was performed via interpreter as the patient is primarily Spanish-speaking.  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 She has never used smokeless tobacco. She reports that she does not drink alcohol and does not use drugs. family history includes Hypertension in her mother. No Known Allergies    Outpatient Encounter Medications as of 02/02/2021  Medication Sig   ferrous sulfate (FERROUSUL) 325 (65 FE) MG tablet Take 1 tablet (325 mg total) by mouth every other day.   Potassium Citrate 15 MEQ (1620 MG) TBCR Take 2 tablets by mouth 2 (two) times daily.   No facility-administered encounter medications on file as of  02/02/2021.    REVIEW OF SYSTEMS  : All other systems reviewed and negative except where noted in the History of Present Illness.   PHYSICAL EXAM: BP 100/60   Pulse 83   Ht 4' 11.75" (1.518 m)   Wt 155 lb (70.3 kg)   BMI 30.53 kg/m  General: Well developed Hispanic female in no acute distress Head: Normocephalic and atraumatic Eyes:  Sclerae anicteric, conjunctiva pink. Ears: Normal auditory acuity Lungs: Clear throughout to auscultation; no W/R/R. Heart: Regular rate and rhythm; no M/R/G. Abdomen: Soft, non-distended.  BS present.  Mild LLQ TTP. Rectal: Will be done at the time of colonoscopy. Musculoskeletal: Symmetrical with no gross deformities  Skin: No lesions on visible extremities Extremities: No edema  Neurological: Alert oriented x 4, grossly non-focal Psychological:  Alert and cooperative. Normal mood and affect  ASSESSMENT AND PLAN: *Abnormal CT scan showing a 3.8 cm intraluminal polypoid mass in the ascending colon highly suspicious for villous adenoma or colon carcinoma.  Will plan for colonoscopy with Dr. Fuller Plan next week.  The risks, benefits, and alternatives to colonoscopy were discussed with the patient and she consents to proceed.  *LLQ abdominal pain:  Has been present for the past few months.   *Constipation:  Does ok if she eats fruits and vegetables.  Sometimes has to use laxatives.  Will give a two day bowel prep just to be safe and make sure that she is cleaned out well.   CC:  Ardis Hughs, MD

## 2021-02-02 NOTE — Patient Instructions (Signed)
If you are age 35 or older, your body mass index should be between 23-30. Your Body mass index is 30.53 kg/m. If this is out of the aforementioned range listed, please consider follow up with your Primary Care Provider.  If you are age 100 or younger, your body mass index should be between 19-25. Your Body mass index is 30.53 kg/m. If this is out of the aformentioned range listed, please consider follow up with your Primary Care Provider.   You have been scheduled for a colonoscopy. Please follow written instructions given to you at your visit today.  Please pick up your prep supplies at the pharmacy within the next 1-3 days. If you use inhalers (even only as needed), please bring them with you on the day of your procedure.  The [facility] providers would like to encourage you to use [facility] to communicate with providers for non-urgent requests or questions.  Due to long hold times on the telephone, sending your provider a message by [facility] may be a faster and more efficient way to get a response.  Please allow 48 business hours for a response.  Please remember that this is for non-urgent requests.   It was a pleasure to see you today!  Thank you for trusting me with your gastrointestinal care!     Alonza Bogus, PA-C

## 2021-02-08 ENCOUNTER — Encounter: Payer: Self-pay | Admitting: Gastroenterology

## 2021-02-08 ENCOUNTER — Other Ambulatory Visit: Payer: Self-pay

## 2021-02-08 ENCOUNTER — Ambulatory Visit (AMBULATORY_SURGERY_CENTER): Payer: Self-pay | Admitting: Gastroenterology

## 2021-02-08 VITALS — BP 98/64 | HR 68 | Temp 97.7°F | Resp 11 | Ht 59.0 in | Wt 155.0 lb

## 2021-02-08 DIAGNOSIS — D125 Benign neoplasm of sigmoid colon: Secondary | ICD-10-CM

## 2021-02-08 DIAGNOSIS — K640 First degree hemorrhoids: Secondary | ICD-10-CM

## 2021-02-08 DIAGNOSIS — K6389 Other specified diseases of intestine: Secondary | ICD-10-CM

## 2021-02-08 DIAGNOSIS — R933 Abnormal findings on diagnostic imaging of other parts of digestive tract: Secondary | ICD-10-CM

## 2021-02-08 MED ORDER — SODIUM CHLORIDE 0.9 % IV SOLN: 500.0000 mL | Freq: Once | INTRAVENOUS | Status: DC

## 2021-02-08 NOTE — Progress Notes (Signed)
S.B. vital signs.

## 2021-02-08 NOTE — Progress Notes (Signed)
Pt's states no medical or surgical changes since previsit or office visit. 

## 2021-02-08 NOTE — Op Note (Signed)
[facility] Patient Name: Jacqueline Watkins Procedure Date: 02/08/2021 9:39 AM MRN: [id number] Endoscopist: Ladene Artist , MD Age: 35 Referring MD:  Date of Birth: [birth year] Gender: Female Account #: [id number] Procedure:                Colonoscopy Indications:              Abnormal CT of the GI tract - ascending colon                            polypoid mass Medicines:                Monitored Anesthesia Care Procedure:                Pre-Anesthesia Assessment:                           - Prior to the procedure, a History and Physical                            was performed, and patient medications and                            allergies were reviewed. The patient's tolerance of                            previous anesthesia was also reviewed. The risks                            and benefits of the procedure and the sedation                            options and risks were discussed with the patient.                            All questions were answered, and informed consent                            was obtained. Prior Anticoagulants: The patient has                            taken no previous anticoagulant or antiplatelet                            agents. ASA Grade Assessment: III - A patient with                            severe systemic disease. After reviewing the risks                            and benefits, the patient was deemed in                            satisfactory condition to undergo the procedure.  After obtaining informed consent, the colonoscope                            was passed under direct vision. Throughout the                            procedure, the patient's blood pressure, pulse, and                            oxygen saturations were monitored continuously. The                            Olympus CF-HQ190L (Serial# [id number]) Colonoscope was                            introduced through the anus and advanced  to the the                            cecum, identified by appendiceal orifice and                            ileocecal valve. The ileocecal valve, appendiceal                            orifice, and rectum were photographed. The quality                            of the bowel preparation was excellent. The                            colonoscopy was performed without difficulty. The                            patient tolerated the procedure well. Scope In: 9:41:26 AM Scope Out: 9:55:24 AM Scope Withdrawal Time: 0 hours 12 minutes 4 seconds  Total Procedure Duration: 0 hours 13 minutes 58 seconds  Findings:                 The perianal and digital rectal examinations were                            normal.                           A 40 mm polyp was found in the ascending colon. The                            polyp was soft, broad based, multi-lobulated and                            semi-sessile. It extended to the hepatic flexure.                            Biopsies far from the edges of the polyp were taken  with a cold forceps for histology. Two sites                            proximal to the polyp were tattooed with injection                            of 4 mL of Spot (carbon black), 2 mL each.                           Internal hemorrhoids were found during                            retroflexion. The hemorrhoids were small and Grade                            I (internal hemorrhoids that do not prolapse).                           The exam was otherwise without abnormality on                            direct and retroflexion views. Complications:            No immediate complications. Estimated blood loss:                            None. Estimated Blood Loss:     Estimated blood loss: none. Impression:               - One 40 mm polyp in the ascending colon. Biopsied.                            Tattooed.                           - Internal  hemorrhoids.                           - The examination was otherwise normal on direct                            and retroflexion views. Recommendation:           - Repeat colonoscopy after studies are complete for                            surveillance based on pathology results.                           - Patient has a contact number available for                            emergencies. The signs and symptoms of potential                            delayed complications were discussed with the  patient. Return to normal activities tomorrow.                            Written discharge instructions were provided to the                            patient.                           - Resume previous diet.                           - Continue present medications.                           - Await pathology results.                           - Review with Dr. Rush Landmark for possible EMR vs                            colorectal surgery referral for mgmt. Ladene Artist, MD 02/08/2021 10:06:06 AM This report has been signed electronically.

## 2021-02-08 NOTE — Progress Notes (Signed)
Called to room to assist during endoscopic procedure.  Patient ID and intended procedure confirmed with present staff. Received instructions for my participation in the procedure from the performing physician.  

## 2021-02-08 NOTE — Patient Instructions (Addendum)
YOU HAD AN ENDOSCOPIC PROCEDURE TODAY AT [facility]:   Refer to the procedure report that was given to you for any specific questions about what was found during the examination.  If the procedure report does not answer your questions, please call your gastroenterologist to clarify.  If you requested that your care partner not be given the details of your procedure findings, then the procedure report has been included in a sealed envelope for you to review at your convenience later.  YOU SHOULD EXPECT: Some feelings of bloating in the abdomen. Passage of more gas than usual.  Walking can help get rid of the air that was put into your GI tract during the procedure and reduce the bloating. If you had a lower endoscopy (such as a colonoscopy or flexible sigmoidoscopy) you may notice spotting of blood in your stool or on the toilet paper. If you underwent a bowel prep for your procedure, you may not have a normal bowel movement for a few days.  Please Note:  You might notice some irritation and congestion in your nose or some drainage.  This is from the oxygen used during your procedure.  There is no need for concern and it should clear up in a day or so.  SYMPTOMS TO REPORT IMMEDIATELY:  Following lower endoscopy (colonoscopy or flexible sigmoidoscopy):  Excessive amounts of blood in the stool  Significant tenderness or worsening of abdominal pains  Swelling of the abdomen that is new, acute  Fever of 100F or higher   For urgent or emergent issues, a gastroenterologist can be reached at any hour by calling [phone]. Do not use MyChart messaging for urgent concerns.    DIET:  We do recommend a small meal at first, but then you may proceed to your regular diet.  Drink plenty of fluids but you should avoid alcoholic beverages for 24 hours.  ACTIVITY:  You should plan to take it easy for the rest of today and you should NOT DRIVE or use heavy machinery until tomorrow (because  of the sedation medicines used during the test).    FOLLOW UP: Our staff will call the number listed on your records 48-72 hours following your procedure to check on you and address any questions or concerns that you may have regarding the information given to you following your procedure. If we do not reach you, we will leave a message.  We will attempt to reach you two times.  During this call, we will ask if you have developed any symptoms of COVID 19. If you develop any symptoms (ie: fever, flu-like symptoms, shortness of breath, cough etc.) before then, please call [phone].  If you test positive for Covid 19 in the 2 weeks post procedure, please call and report this information to [place].    If any biopsies were taken you will be contacted by phone or by letter within the next 1-3 weeks.  Please call us at [phone] if you have not heard about the biopsies in 3 weeks.    SIGNATURES/CONFIDENTIALITY: You and/or your care partner have signed paperwork which will be entered into your electronic medical record.  These signatures attest to the fact that that the information above on your After Visit Summary has been reviewed and is understood.  Full responsibility of the confidentiality of this discharge information lies with you and/or your care-partner. USTED TUVO UN PROCEDIMIENTO ENDOSCPICO HOY EN EL  ENDOSCOPY CENTER:   Lea el informe del procedimiento que  se le entreg para cualquier pregunta especfica sobre lo que se Primary school teacher.  Si el informe del examen no responde a sus preguntas, por favor llame a su gastroenterlogo para aclararlo.  Si usted solicit que no se le den [employer] de lo que se Estate manager/land agent en su procedimiento al [employer] va a cuidar, entonces el informe del procedimiento se ha incluido en un sobre sellado para que usted lo revise despus cuando le sea ms conveniente.   LO QUE PUEDE ESPERAR: Algunas sensaciones de hinchazn en el abdomen.  Puede  tener ms gases de lo normal.  El caminar puede ayudarle a eliminar el aire que se le puso en el tracto gastrointestinal durante el procedimiento y reducir la hinchazn.  Si le hicieron una endoscopia inferior (como una colonoscopia o una sigmoidoscopia flexible), podra notar manchas de sangre en las heces fecales o en el papel higinico.  Si se someti a una preparacin intestinal para su procedimiento, es posible que no tenga una evacuacin intestinal normal durante [employer].   Tenga en cuenta:  Es posible que note un poco de irritacin y congestin en la nariz o algn drenaje.  Esto es debido al oxgeno [employer] durante su procedimiento.  No hay que preocuparse y esto debe desaparecer ms o Scientist, research (medical).   SNTOMAS PARA REPORTAR INMEDIATAMENTE:  Despus de una endoscopia inferior (colonoscopia o sigmoidoscopia flexible):  Cantidades excesivas de sangre en las heces fecales  Sensibilidad significativa o empeoramiento de los dolores abdominales   Hinchazn aguda del abdomen que antes no tena   Fiebre de 100F o ms   Despus de la endoscopia superior (EGD)  Vmitos de Biochemist, clinical o material como caf molido   Dolor en el pecho o dolor debajo de los omplatos que antes no tena   Dolor o dificultad persistente para tragar  Falta de aire que antes no tena   Fiebre de 100F o ms  Heces fecales negras y pegajosas   Para asuntos urgentes o de Freight forwarder, puede comunicarse con un gastroenterlogo a cualquier hora llamando al [phone].  DIETA:  Recomendamos una comida pequea al principio, pero luego puede continuar con su dieta normal.  Tome muchos lquidos, Teacher, adult education las bebidas alcohlicas durante 24 horas.    ACTIVIDAD:  Debe planear tomarse las cosas con calma por el resto del da y no debe CONDUCIR ni usar maquinaria pesada Programmer, applications (debido a los medicamentos de sedacin utilizados durante el examen).     SEGUIMIENTO: Nuestro personal llamar al nmero que aparece en  su historial al siguiente da hbil de su procedimiento para ver cmo se siente y para responder cualquier pregunta o inquietud que pueda tener con respecto a la informacin que se le dio despus del procedimiento. Si no podemos contactarle, le dejaremos un mensaje.  Sin embargo, si se siente bien y no tiene Paediatric nurse, no es necesario que nos devuelva la llamada.  Asumiremos que ha regresado a sus actividades diarias normales sin incidentes. Si se le tomaron algunas biopsias, le contactaremos por telfono o por carta en las prximas 3 semanas.  Si no ha sabido [employer] biopsias en el transcurso de 3 semanas, por favor llmenos al [phone].   FIRMAS/CONFIDENCIALIDAD: Usted y/o el acompaante que le cuide han firmado documentos que se ingresarn en su historial mdico electrnico.  Estas firmas atestiguan el hecho de que la informacin anterior

## 2021-02-08 NOTE — Progress Notes (Signed)
Report given to PACU, vss 

## 2021-02-09 ENCOUNTER — Telehealth: Payer: Self-pay

## 2021-02-09 DIAGNOSIS — R933 Abnormal findings on diagnostic imaging of other parts of digestive tract: Secondary | ICD-10-CM

## 2021-02-09 DIAGNOSIS — K6389 Other specified diseases of intestine: Secondary | ICD-10-CM

## 2021-02-09 NOTE — Telephone Encounter (Signed)
Patient informed to come in for lab work in the next few days and patient also informed of appt with Dr. Rush Landmark on 03/28/21.

## 2021-02-09 NOTE — Telephone Encounter (Signed)
Scheduled clinic visit with Dr. Rush Landmark for possible EMR on 03/28/21 at 8:50am.

## 2021-02-09 NOTE — Telephone Encounter (Signed)
-----   Message from Ladene Artist, MD sent at 02/09/2021  2:41 PM EDT ----- Please contact this spanish speaking patient to make her aware that GM will see her in clinic and attempt to remove her large polyp IF her biopsy is benign (as expected) and her CEA is negative (as expected). Please have her come in for a CEA in next few days. We will contact her with pathology and CEA results when available.  MS ----- Message ----- From: Ladene Artist, MD Sent: 02/09/2021   2:41 PM EDT To: Irving Copas., MD  GM, Will await the final pathology, obtain CEA and if both indicate a benign process will schedule a clinic visit and procedure with you.  Thanks,  MS ----- Message ----- From: Irving Copas., MD Sent: 02/09/2021   4:04 AM EDT To: Ladene Artist, MD  MS, Will be interesting to see the final pathology. Happy to try and be of assistance and proceed with EMR if pathology is unremarkable but I am already scheduled out with procedures until August. If the pathology is nonmalignant and you speak with her and she is okay with a clinic visit and a procedure in August I be happy to try to help. Would obtain a CEA level in the interim to ensure that it is not elevated if that is the case to help [place] feel more comfortable about procedure wait time. Let me know. Thanks. GM ----- Message ----- From: Ladene Artist, MD Sent: 02/08/2021   2:41 PM EDT To: Irving Copas., MD  Gabe,  This 35 yo Spanish speaking female has a 4 cm broad based, soft, polyp in the ascending colon extending to the hepatic flexure. I took a couple very superficial biopsies in the middle of the polyp and tattooed the area in case surgery was needed. The location extending to the hepatic flexure could make this more challenging from an EMR perspective. Would you please assess for possible EMR vs surgical referral.  Thanks,  Norberto Sorenson

## 2021-02-10 ENCOUNTER — Encounter: Payer: Self-pay | Admitting: Gastroenterology

## 2021-02-10 ENCOUNTER — Telehealth: Payer: Self-pay

## 2021-02-10 NOTE — Telephone Encounter (Signed)
  Follow up Call-  Call back number 02/08/2021  Post procedure Call Back phone  # [phone]  Permission to leave phone message Yes  Some recent data might be hidden     Patient questions:  Do you have a fever, pain , or abdominal swelling? No. Pain Score  0 *  Have you tolerated food without any problems? Yes.    Have you been able to return to your normal activities? Yes.    Do you have any questions about your discharge instructions: Diet   No. Medications  No. Follow up visit  No.  Do you have questions or concerns about your Care? No.  Actions: * If pain score is 4 or above: No action needed, pain <4.  Have you developed a fever since your procedure? no  2.   Have you had an respiratory symptoms (SOB or cough) since your procedure? no  3.   Have you tested positive for COVID 19 since your procedure no  4.   Have you had any family members/close contacts diagnosed with the COVID 19 since your procedure?  no   If yes to any of these questions please route to Joylene John, RN and Joella Prince, RN

## 2021-02-13 ENCOUNTER — Other Ambulatory Visit: Payer: Self-pay

## 2021-02-13 DIAGNOSIS — K6389 Other specified diseases of intestine: Secondary | ICD-10-CM

## 2021-02-13 DIAGNOSIS — R933 Abnormal findings on diagnostic imaging of other parts of digestive tract: Secondary | ICD-10-CM

## 2021-02-14 LAB — CEA: CEA: 0.5 ng/mL

## 2021-02-21 ENCOUNTER — Other Ambulatory Visit: Payer: Self-pay

## 2021-02-21 DIAGNOSIS — K6389 Other specified diseases of intestine: Secondary | ICD-10-CM

## 2021-02-21 DIAGNOSIS — K5900 Constipation, unspecified: Secondary | ICD-10-CM

## 2021-02-21 DIAGNOSIS — R933 Abnormal findings on diagnostic imaging of other parts of digestive tract: Secondary | ICD-10-CM

## 2021-02-21 DIAGNOSIS — R1032 Left lower quadrant pain: Secondary | ICD-10-CM

## 2021-02-21 MED ORDER — PEG 3350-KCL-NA BICARB-NACL 420 G PO SOLR: 4000.0000 mL | Freq: Once | ORAL | 0 refills | Status: AC

## 2021-02-21 NOTE — Progress Notes (Signed)
The pt will be given the information at her upcoming office visit.  I have also mailed the information to her home.

## 2021-02-28 ENCOUNTER — Other Ambulatory Visit (INDEPENDENT_AMBULATORY_CARE_PROVIDER_SITE_OTHER): Payer: Self-pay

## 2021-02-28 ENCOUNTER — Encounter: Payer: Self-pay | Admitting: Gastroenterology

## 2021-02-28 ENCOUNTER — Ambulatory Visit: Payer: Self-pay | Admitting: Gastroenterology

## 2021-02-28 VITALS — BP 120/80 | HR 91 | Ht 61.0 in | Wt 158.8 lb

## 2021-02-28 DIAGNOSIS — R933 Abnormal findings on diagnostic imaging of other parts of digestive tract: Secondary | ICD-10-CM

## 2021-02-28 DIAGNOSIS — Z86010 Personal history of colonic polyps: Secondary | ICD-10-CM

## 2021-02-28 DIAGNOSIS — D122 Benign neoplasm of ascending colon: Secondary | ICD-10-CM

## 2021-02-28 LAB — CBC
HCT: 36.3 % (ref 36.0–46.0)
Hemoglobin: 11.2 g/dL — ABNORMAL LOW (ref 12.0–15.0)
MCHC: 30.9 g/dL (ref 30.0–36.0)
MCV: 75.2 fl — ABNORMAL LOW (ref 78.0–100.0)
Platelets: 351 10*3/uL (ref 150.0–400.0)
RBC: 4.82 Mil/uL (ref 3.87–5.11)
RDW: 16.7 % — ABNORMAL HIGH (ref 11.5–15.5)
WBC: 8.8 10*3/uL (ref 4.0–10.5)

## 2021-02-28 LAB — BASIC METABOLIC PANEL
BUN: 11 mg/dL (ref 6–23)
CO2: 24 mEq/L (ref 19–32)
Calcium: 9.3 mg/dL (ref 8.4–10.5)
Chloride: 106 mEq/L (ref 96–112)
Creatinine, Ser: 0.7 mg/dL (ref 0.40–1.20)
GFR: 112.5 mL/min (ref >60.00)
Glucose, Bld: 81 mg/dL (ref 70–99)
Potassium: 4.2 mEq/L (ref 3.5–5.1)
Sodium: 136 mEq/L (ref 135–145)

## 2021-02-28 LAB — PROTIME-INR
INR: 1.1 ratio — ABNORMAL HIGH (ref 0.8–1.0)
Prothrombin Time: 12.1 s (ref 9.6–13.1)

## 2021-02-28 MED ORDER — PEG 3350-KCL-NA BICARB-NACL 420 G PO SOLR: 4000.0000 mL | Freq: Once | ORAL | 0 refills | Status: AC

## 2021-02-28 NOTE — Patient Instructions (Addendum)
Le han programado una colonoscopia. Siga las instrucciones escritas que se le dieron en su visita de hoy. Recoja sus suministros de preparacin en la farmacia dentro de los prximos 1 a 3 das. Si [place] inhaladores (aunque solo sea necesario), trigalos el da de su procedimiento.   Su proveedor le ha pedido que vaya al nivel del stano para el trabajo de laboratorio antes de salir hoy. Presione "B" en el ascensor. El laboratorio est ubicado en la primera puerta a la izquierda al salir del Materials engineer.  Hemos enviado los siguientes medicamentos a su farmacia para que los recoja a su conveniencia: [place]  Debido a los cambios recientes en las leyes de atencin mdica, es posible que vea los [place] de sus estudios de imgenes y de Designer, fashion/clothing en MyChart antes de que su proveedor haya tenido la oportunidad de revisarlos. Entendemos que en algunos casos puede haber resultados confusos o preocupantes para usted. No todos los resultados de laboratorio regresan en el mismo perodo de tiempo y el proveedor puede estar esperando mltiples resultados para Primary school teacher. Denos 106 horas para que su proveedor revise minuciosamente todos los resultados antes de comunicarse con la oficina para Audiological scientist.   Si tiene 21 aos o menos, su ndice de [employer] corporal debe estar entre 57 y 16. Su ndice de masa corporal es de 30 kg/m. Si esto est fuera del rango mencionado anteriormente, considere hacer un seguimiento con su proveedor de Midwife.  Los proveedores de Financial controller GI desean alentarlo a que use MYCHART para comunicarse con los proveedores para solicitudes o preguntas que no sean urgentes. Debido a los Astronomer de espera en el telfono, enviar un mensaje a su proveedor por [employer] puede ser una forma ms rpida y eficiente de obtener una respuesta. Espere 48 horas hbiles para obtener [employer]. Recuerde que esto es para solicitudes no urgentes.  Gracias por elegirnos a m Electronics engineer Gastroenterology.  Dra. Mansurty

## 2021-02-28 NOTE — Progress Notes (Addendum)
[place] VISIT   Primary Care Provider Patient, No Pcp Per (Inactive) No address on file None  Referring Provider Dr. Fuller Plan  Patient Profile: Jacqueline Watkins is a 35 y.o. 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  The patient presents to the [facility] for an evaluation and management of problem(s) noted below:  Problem List 1. Adenomatous polyp of ascending colon   2. Abnormal colonoscopy   3. Abnormal CT scan, colon     History of Present Illness Please see initial consultation note by PA Zehr for full details of HPI.  Interval History Today, the patient comes in for a scheduled follow-up.  Today's clinic visit is performed with interpreter services available.  The patient underwent the scheduled colonoscopy with Dr. Fuller Plan for further evaluation of the abnormality noted on CT.  She was found to have a large polypoid mass/lesion in the hepatic flexure that was biopsied and returned as an adenoma without evidence of high-grade dysplasia or cancer.  It is for this reason that the patient is referred for consideration of an attempt at advanced resection.  She does not have any GI symptoms at this time.  However, she does describe that for approximately 2 weeks after her colonoscopy she dealt with issues of headaches, dizziness, and overall issues with upper respiratory symptoms and coughing and sinus drainage.  She not sure why she experience all of the symptoms after her last colonoscopy but took her a few weeks to improve and now she is finally back to normal.  She has concerns and is worried about why she has this type of precancerous lesion (at a minimum) at such a young age and the implications that this will have for her children.  Patient denies any blood in her stools.  GI Review of Systems Positive as above Negative for pyrosis, dysphagia,  odynophagia, nausea, vomiting, pain, alteration of bowel habits  Review of Systems General: Denies fevers/chills/weight loss unintentionally Cardiovascular: Denies chest pain/palpitations Pulmonary: Denies shortness of breath Gastroenterological: See HPI Genitourinary: Denies darkened urine Hematological: Denies easy bruising/bleeding Dermatological: Denies jaundice Psychological: Mood is stable though she has some anxiety about undergoing another procedure   Medications Current Outpatient Medications  Medication Sig Dispense Refill   ferrous sulfate (FERROUSUL) 325 (65 FE) MG tablet Take 1 tablet (325 mg total) by mouth every other day. 30 tablet 3   Probiotic Product (PROBIOTIC PO) Take 1 capsule by mouth daily.     No current facility-administered medications for this visit.    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  Tobacco Use   Smoking status: Never   Smokeless tobacco: Never  Vaping Use   Vaping Use: Never used  Substance and Sexual Activity   Alcohol use: No   Drug use: No   Sexual activity: Yes  Other  Topics Concern   Not on file  Social History Narrative   Not on file   Social Determinants of Health   Financial Resource Strain: Not on file  Food Insecurity: No Food Insecurity   Worried About Running Out of Food in the Last Year: Never true   Ran Out of Food in the Last Year: Never true  Transportation Needs:  No Transportation Needs   Lack of Transportation (Medical): No   Lack of Transportation (Non-Medical): No  Physical Activity: Not on file  Stress: Not on file  Social Connections: Not on file  Intimate Partner Violence: Not on file   Family History  Problem Relation Age of Onset   Hypertension Mother    Colon cancer Neg Hx    Esophageal cancer Neg Hx    Inflammatory bowel disease Neg Hx    Liver disease Neg Hx    Pancreatic cancer Neg Hx    Rectal cancer Neg Hx    Stomach cancer Neg Hx    I have reviewed her medical, social, and family history in detail and updated the electronic medical record as necessary.    PHYSICAL EXAMINATION  BP 120/80   Pulse 91   Ht 5\' 1"  (1.549 m)   Wt 158 lb 12.8 oz (72 kg)   SpO2 99%   BMI 30.00 kg/m  Wt Readings from Last 3 Encounters:  02/28/21 158 lb 12.8 oz (72 kg)  02/08/21 155 lb (70.3 kg)  02/02/21 155 lb (70.3 kg)  GEN: NAD, appears stated age, doesn't appear chronically ill PSYCH: Cooperative, without pressured speech EYE: Conjunctivae pink, sclerae anicteric ENT: MMM CV: Nontachycardic RESP: No audible wheezing GI: NABS, soft, NT/ND, without rebound or guarding, no HSM appreciated MSK/EXT: No lower extremity edema SKIN: No jaundice NEURO:  Alert & Oriented x 3, no focal deficits   REVIEW OF DATA  I reviewed the following data at the time of this encounter:  GI Procedures and Studies  June 2022 colonoscopy - One 40 mm polyp in the ascending colon. Biopsied. Tattooed. - Internal hemorrhoids. - The examination was otherwise normal on direct and retroflexion views. Pathology Diagnosis Surgical [P], colon, sigmoid, polyp (1) - TUBULAR ADENOMA(S). - NO HIGH GRADE DYPLASIA OR CARCINOMA.  Laboratory Studies  Reviewed those in epic CEA level normal  Imaging Studies  May 2022 CT abdomen pelvis without contrast IMPRESSION: Bilateral medullary nephrocalcinosis. No evidence of ureteral calculi or hydronephrosis. 3.8 cm  intraluminal polypoid mass in ascending colon, highly suspicious for villous adenoma or colon carcinoma. Colonoscopy is recommended for further evaluation. Cholelithiasis. No radiographic evidence of cholecystitis.   ASSESSMENT  [name] is a 35 y.o. 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  The patient is seen today for evaluation and management of:  1. Adenomatous polyp of ascending colon   2. Abnormal colonoscopy   3. Abnormal CT scan, colon    The patient is clinically and hemodynamically stable.  She is significantly concerned as to why she would have precancerous polyp tissue at this time and the implications of this for her children.  At this point the most important next step is to try and get this taken care of/removed.  Based upon the description and endoscopic pictures I do feel that it is reasonable to pursue an Advanced Polypectomy attempt of the polyp/lesion.  We discussed some of the techniques of advanced polypectomy which include Endoscopic Mucosal Resection, OVESCO Full-Thickness Resection, Endorotor Morcellation,  and Tissue Ablation via Fulguration.  We also reviewed images of typical techniques as noted above.  The risks and benefits of endoscopic evaluation were discussed with the patient; these include but are not limited to the risk of perforation, infection, bleeding, missed lesions, lack of diagnosis, severe illness requiring hospitalization, as well as anesthesia and sedation related illnesses.  During attempts at advanced resection, the risks of bleeding and perforation/leak are increased as opposed to diagnostic and screening procedures, and that was discussed with the patient as well.   In addition, I explained that with the possible need for piecemeal resection, subsequent short-interval endoscopic evaluation for follow up and potential retreatment of the lesion/area may be  necessary.  I did offer, a referral to surgery in order for patient to have opportunity to discuss surgical management/intervention prior to finalizing decision for attempt at endoscopic removal, however, the patient deferred on this.  If, after attempt at removal of the polyp/lesion, it is found that the patient has a complication or that an invasive lesion or malignant lesion is found, or that the polyp/lesion continues to recur, the patient is aware and understands that surgery may still be indicated/required.  All patient questions were answered, to the best of my ability, and the patient agrees to the aforementioned plan of action with follow-up as indicated.   PLAN  Preprocedure labs to be obtained Proceed with scheduling colonoscopy with EMR attempt Further recommendations versus surgical referral will be understood after colonoscopy attempt in the coming weeks   Orders Placed This Encounter  Procedures   CBC   Basic Metabolic Panel (BMET)   INR/PT    New Prescriptions   No medications on file   Modified Medications   No medications on file    Planned Follow Up No follow-ups on file.   Total Time in Face-to-Face and in Coordination of Care for patient including independent/personal interpretation/review of prior testing, medical history, examination, medication adjustment, communicating results with the patient directly, and documentation with the EHR is 35 minutes.   Justice Britain, MD [facility] Advanced Endoscopy Office # [phone]

## 2021-03-02 DIAGNOSIS — R933 Abnormal findings on diagnostic imaging of other parts of digestive tract: Secondary | ICD-10-CM | POA: Insufficient documentation

## 2021-03-02 DIAGNOSIS — D122 Benign neoplasm of ascending colon: Secondary | ICD-10-CM | POA: Insufficient documentation

## 2021-03-02 NOTE — Addendum Note (Signed)
Addended by: Justice Britain on: 03/02/2021 04:43 PM   Modules accepted: Orders

## 2021-03-03 ENCOUNTER — Other Ambulatory Visit: Payer: Self-pay

## 2021-03-03 DIAGNOSIS — K6389 Other specified diseases of intestine: Secondary | ICD-10-CM

## 2021-03-14 ENCOUNTER — Other Ambulatory Visit (INDEPENDENT_AMBULATORY_CARE_PROVIDER_SITE_OTHER): Payer: Self-pay

## 2021-03-14 DIAGNOSIS — K6389 Other specified diseases of intestine: Secondary | ICD-10-CM

## 2021-03-14 LAB — IBC + FERRITIN
Ferritin: 4.9 ng/mL — ABNORMAL LOW (ref 10.0–291.0)
Iron: 12 ug/dL — ABNORMAL LOW (ref 42–145)
Saturation Ratios: 2.7 % — ABNORMAL LOW (ref 20.0–50.0)
Transferrin: 323 mg/dL (ref 212.0–360.0)

## 2021-03-14 LAB — FOLATE: Folate: 20.5 ng/mL (ref >5.9)

## 2021-03-14 LAB — VITAMIN B12: Vitamin B-12: 629 pg/mL (ref 211–911)

## 2021-03-28 ENCOUNTER — Ambulatory Visit: Payer: Self-pay | Admitting: Gastroenterology

## 2021-03-30 ENCOUNTER — Encounter (HOSPITAL_COMMUNITY): Payer: Self-pay | Admitting: Gastroenterology

## 2021-03-30 ENCOUNTER — Other Ambulatory Visit: Payer: Self-pay

## 2021-04-10 ENCOUNTER — Other Ambulatory Visit: Payer: Self-pay

## 2021-04-10 ENCOUNTER — Ambulatory Visit (HOSPITAL_COMMUNITY)
Admission: RE | Admit: 2021-04-10 | Discharge: 2021-04-10 | Disposition: A | Discharge status: Home / Self Care | Payer: Self-pay | Attending: Gastroenterology | Admitting: Gastroenterology

## 2021-04-10 ENCOUNTER — Encounter (HOSPITAL_COMMUNITY)
Admission: RE | Disposition: A | Discharge status: Home / Self Care | Payer: Self-pay | Source: Home / Self Care | Attending: Gastroenterology

## 2021-04-10 ENCOUNTER — Ambulatory Visit (HOSPITAL_COMMUNITY): Payer: Self-pay | Admitting: Anesthesiology

## 2021-04-10 ENCOUNTER — Ambulatory Visit (HOSPITAL_COMMUNITY): Payer: Self-pay

## 2021-04-10 ENCOUNTER — Encounter (HOSPITAL_COMMUNITY): Payer: Self-pay | Admitting: Gastroenterology

## 2021-04-10 DIAGNOSIS — K449 Diaphragmatic hernia without obstruction or gangrene: Secondary | ICD-10-CM | POA: Insufficient documentation

## 2021-04-10 DIAGNOSIS — K644 Residual hemorrhoidal skin tags: Secondary | ICD-10-CM | POA: Insufficient documentation

## 2021-04-10 DIAGNOSIS — K2289 Other specified disease of esophagus: Secondary | ICD-10-CM | POA: Insufficient documentation

## 2021-04-10 DIAGNOSIS — K6389 Other specified diseases of intestine: Secondary | ICD-10-CM

## 2021-04-10 DIAGNOSIS — R1032 Left lower quadrant pain: Secondary | ICD-10-CM

## 2021-04-10 DIAGNOSIS — K5900 Constipation, unspecified: Secondary | ICD-10-CM

## 2021-04-10 DIAGNOSIS — K3189 Other diseases of stomach and duodenum: Secondary | ICD-10-CM | POA: Insufficient documentation

## 2021-04-10 DIAGNOSIS — D123 Benign neoplasm of transverse colon: Secondary | ICD-10-CM | POA: Insufficient documentation

## 2021-04-10 DIAGNOSIS — D124 Benign neoplasm of descending colon: Secondary | ICD-10-CM

## 2021-04-10 DIAGNOSIS — R933 Abnormal findings on diagnostic imaging of other parts of digestive tract: Secondary | ICD-10-CM

## 2021-04-10 DIAGNOSIS — D122 Benign neoplasm of ascending colon: Secondary | ICD-10-CM | POA: Insufficient documentation

## 2021-04-10 DIAGNOSIS — R109 Unspecified abdominal pain: Secondary | ICD-10-CM

## 2021-04-10 DIAGNOSIS — K642 Third degree hemorrhoids: Secondary | ICD-10-CM | POA: Insufficient documentation

## 2021-04-10 DIAGNOSIS — K631 Perforation of intestine (nontraumatic): Secondary | ICD-10-CM

## 2021-04-10 DIAGNOSIS — D509 Iron deficiency anemia, unspecified: Secondary | ICD-10-CM | POA: Insufficient documentation

## 2021-04-10 HISTORY — PX: ENDOSCOPIC MUCOSAL RESECTION: SHX6839

## 2021-04-10 HISTORY — PX: ESOPHAGOGASTRODUODENOSCOPY (EGD) WITH PROPOFOL: SHX5813

## 2021-04-10 HISTORY — PX: BIOPSY: SHX5522

## 2021-04-10 HISTORY — PX: HEMOSTASIS CLIP PLACEMENT: SHX6857

## 2021-04-10 HISTORY — PX: HOT HEMOSTASIS: SHX5433

## 2021-04-10 HISTORY — PX: COLONOSCOPY WITH PROPOFOL: SHX5780

## 2021-04-10 HISTORY — PX: POLYPECTOMY: SHX5525

## 2021-04-10 LAB — PREGNANCY, URINE: Preg Test, Ur: NEGATIVE

## 2021-04-10 SURGERY — COLONOSCOPY WITH PROPOFOL
Anesthesia: Monitor Anesthesia Care

## 2021-04-10 MED ORDER — ONDANSETRON HCL 4 MG/2ML IJ SOLN
INTRAMUSCULAR | Status: DC | PRN
  Administered 2021-04-10: 4 mg via INTRAVENOUS

## 2021-04-10 MED ORDER — CIPROFLOXACIN IN D5W 400 MG/200ML IV SOLN
INTRAVENOUS | Status: AC
  Filled 2021-04-10: qty 200

## 2021-04-10 MED ORDER — CIPROFLOXACIN IN D5W 400 MG/200ML IV SOLN
400.0000 mg | Freq: Once | INTRAVENOUS | Status: AC
  Administered 2021-04-10: 400 mg via INTRAVENOUS

## 2021-04-10 MED ORDER — PROPOFOL 500 MG/50ML IV EMUL
INTRAVENOUS | Status: DC | PRN
  Administered 2021-04-10: 125 ug/kg/min via INTRAVENOUS

## 2021-04-10 MED ORDER — CIPROFLOXACIN HCL 500 MG PO TABS: 500.0000 mg | ORAL_TABLET | Freq: Two times a day (BID) | ORAL | 0 refills | Status: AC

## 2021-04-10 MED ORDER — PROPOFOL 10 MG/ML IV BOLUS
INTRAVENOUS | Status: AC
  Filled 2021-04-10: qty 20

## 2021-04-10 MED ORDER — LIDOCAINE 2% (20 MG/ML) 5 ML SYRINGE
INTRAMUSCULAR | Status: DC | PRN
  Administered 2021-04-10: 60 mg via INTRAVENOUS

## 2021-04-10 MED ORDER — LIDOCAINE HCL URETHRAL/MUCOSAL 2 % EX GEL
CUTANEOUS | Status: AC
  Filled 2021-04-10: qty 30

## 2021-04-10 MED ORDER — FENTANYL CITRATE (PF) 100 MCG/2ML IJ SOLN
25.0000 ug | Freq: Once | INTRAMUSCULAR | Status: AC
  Administered 2021-04-10: 25 ug via INTRAVENOUS

## 2021-04-10 MED ORDER — PROPOFOL 500 MG/50ML IV EMUL
INTRAVENOUS | Status: AC
  Filled 2021-04-10: qty 50

## 2021-04-10 MED ORDER — PROPOFOL 10 MG/ML IV BOLUS
INTRAVENOUS | Status: DC | PRN
  Administered 2021-04-10: 30 mg via INTRAVENOUS
  Administered 2021-04-10: 20 mg via INTRAVENOUS
  Administered 2021-04-10: 30 mg via INTRAVENOUS

## 2021-04-10 MED ORDER — FENTANYL CITRATE (PF) 100 MCG/2ML IJ SOLN
INTRAMUSCULAR | Status: AC
  Filled 2021-04-10: qty 2

## 2021-04-10 MED ORDER — SODIUM CHLORIDE 0.9 % IV SOLN: INTRAVENOUS | Status: DC

## 2021-04-10 MED ORDER — LACTATED RINGERS IV SOLN
INTRAVENOUS | Status: DC
  Administered 2021-04-10: 1000 mL via INTRAVENOUS

## 2021-04-10 SURGICAL SUPPLY — 25 items

## 2021-04-10 NOTE — Discharge Instructions (Signed)
YOU HAD AN ENDOSCOPIC PROCEDURE TODAY: Refer to the procedure report and other information in the discharge instructions given to you for any specific questions about what was found during the examination. If this information does not answer your questions, please call [place] office at [phone] to clarify.  ° °YOU SHOULD EXPECT: Some feelings of bloating in the abdomen. Passage of more gas than usual. Walking can help get rid of the air that was put into your GI tract during the procedure and reduce the bloating. If you had a lower endoscopy (such as a colonoscopy or flexible sigmoidoscopy) you may notice spotting of blood in your stool or on the toilet paper. Some abdominal soreness may be present for a day or two, also. ° °DIET: Your first meal following the procedure should be a light meal and then it is ok to progress to your normal diet. A half-sandwich or bowl of soup is an example of a good first meal. Heavy or fried foods are harder to digest and may make you feel nauseous or bloated. Drink plenty of fluids but you should avoid alcoholic beverages for 24 hours. If you had a esophageal dilation, please see attached instructions for diet.   ° °ACTIVITY: Your care partner should take you home directly after the procedure. You should plan to take it easy, moving slowly for the rest of the day. You can resume normal activity the day after the procedure however YOU SHOULD NOT DRIVE, use power tools, machinery or perform tasks that involve climbing or major physical exertion for 24 hours (because of the sedation medicines used during the test).  ° °SYMPTOMS TO REPORT IMMEDIATELY: °A gastroenterologist can be reached at any hour. Please call [phone]  for any of the following symptoms:  °Following lower endoscopy (colonoscopy, flexible sigmoidoscopy) °Excessive amounts of blood in the stool  °Significant tenderness, worsening of abdominal pains  °Swelling of the abdomen that is new, acute  °Fever of 100° or  higher  °Following upper endoscopy (EGD, EUS, ERCP, esophageal dilation) °Vomiting of blood or coffee ground material  °New, significant abdominal pain  °New, significant chest pain or pain under the shoulder blades  °Painful or persistently difficult swallowing  °New shortness of breath  °Black, tarry-looking or red, bloody stools ° °FOLLOW UP:  °If any biopsies were taken you will be contacted by phone or by letter within the next 1-3 weeks. Call [phone]  if you have not heard about the biopsies in 3 weeks.  °Please also call with any specific questions about appointments or follow up tests. ° °

## 2021-04-10 NOTE — H&P (Addendum)
GASTROENTEROLOGY PROCEDURE H&P NOTE   Primary Care Physician: Patient, No Pcp Per (Inactive)  HPI: Jacqueline Watkins is a 35 y.o. 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   Microcytic anemia 09/11/2018   Pyelonephritis    Sepsis due to urinary tract infection ([place]) 09/11/2018   Past Surgical History:  Procedure Laterality Date   CESAREAN SECTION  2005   COLONOSCOPY     Current Facility-Administered Medications  Medication Dose Route Frequency Provider Last Rate Last Admin   0.9 %  sodium chloride infusion   Intravenous Continuous Mansouraty, Telford Nab., MD       lactated ringers infusion   Intravenous Continuous Mansouraty, Telford Nab., MD        Current Facility-Administered Medications:    0.9 %  sodium chloride infusion, , Intravenous, Continuous, Mansouraty, Telford Nab., MD   lactated ringers infusion, , Intravenous, Continuous, Mansouraty, Telford Nab., MD No Known Allergies Family History  Problem Relation Age of Onset   Hypertension Mother    Colon cancer Neg Hx    Esophageal cancer Neg Hx    Inflammatory bowel disease Neg Hx    Liver disease Neg Hx    Pancreatic cancer Neg Hx    Rectal cancer Neg Hx    Stomach cancer Neg Hx    Social History   Socioeconomic History   Marital status: Single    Spouse name: Not on file   Number of children: 3   Years of education: Not on file   Highest education level: Not on file  Occupational History   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  Tobacco Use   Smoking status: Never   Smokeless tobacco: Never  Vaping Use   Vaping Use: Never used  Substance and Sexual Activity   Alcohol use: No   Drug use: No   Sexual activity: Yes  Other Topics Concern   Not on file  Social History Narrative   Not on file   Social Determinants of Health    Financial Resource Strain: Not on file  Food Insecurity: No Food Insecurity   Worried About Running Out of Food in the Last Year: Never true   Ran Out of Food in the Last Year: Never true  Transportation Needs: No Transportation Needs   Lack of Transportation (Medical): No   Lack of Transportation (Non-Medical): No  Physical Activity: Not on file  Stress: Not on file  Social Connections: Not on file  Intimate Partner Violence: Not on file    Physical Exam: Vital signs in last 24 hours:     GEN: NAD EYE: Sclerae anicteric ENT: MMM CV: Non-tachycardic GI: Soft, NT/ND NEURO:  Alert & Oriented x 3  Lab Results: No results for input(s): WBC, HGB, HCT, PLT in the last 72 hours. BMET No results for input(s): NA, K, CL, CO2, GLUCOSE, BUN, CREATININE, CALCIUM in the last 72 hours. LFT No results for input(s): PROT, ALBUMIN, AST, ALT, ALKPHOS, BILITOT, BILIDIR, IBILI in the last 72 hours. PT/INR No results for input(s): LABPROT, INR in the last 72 hours.   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  The risks and benefits of endoscopic evaluation/treatment were discussed with the patient and/or family; these include but are not limited to  the risk of perforation, infection, bleeding, missed lesions, lack of diagnosis, severe illness requiring hospitalization, as well as anesthesia and sedation related illnesses.  The patient's history has been reviewed, patient examined, no change in status, and deemed stable for procedure.  The patient and/or family is agreeable to proceed.    Justice Britain, MD [facility] Office # [phone]

## 2021-04-10 NOTE — Op Note (Signed)
[facility] Patient Name: Jacqueline Watkins Procedure Date: 04/10/2021 MRN: [id number] Attending MD: Justice Britain , MD Date of Birth: 05/21/1986 CSN: [id number] Age: 35 Admit Type: Outpatient Procedure:                Upper GI endoscopy Indications:              Iron deficiency anemia Providers:                Justice Britain, MD, Baird Cancer, RN, Elspeth Cho Tech., Technician, Alfonso Patten CRNA, CRNA Referring MD:             Pricilla Riffle. Fuller Plan, MD, Alonza Bogus PA, PA Medicines:                Monitored Anesthesia Care Complications:            No immediate complications. Estimated Blood Loss:     Estimated blood loss was minimal. Procedure:                Pre-Anesthesia Assessment:                           - Prior to the procedure, a History and Physical                            was performed, and patient medications and                            allergies were reviewed. The patient's tolerance of                            previous anesthesia was also reviewed. The risks                            and benefits of the procedure and the sedation                            options and risks were discussed with the patient.                            All questions were answered, and informed consent                            was obtained. Prior Anticoagulants: The patient has                            taken no previous anticoagulant or antiplatelet                            agents. ASA Grade Assessment: II - A patient with                            mild systemic disease. After reviewing the risks  and benefits, the patient was deemed in                            satisfactory condition to undergo the procedure.                           After obtaining informed consent, the endoscope was                            passed under direct vision. Throughout the                            procedure,  the patient's blood pressure, pulse, and                            oxygen saturations were monitored continuously. The                            [id number] Olympus endoscope was introduced                            through the mouth, and advanced to the second part                            of duodenum. The upper GI endoscopy was                            accomplished without difficulty. The patient                            tolerated the procedure. Scope In: Scope Out: Findings:      No gross lesions were noted in the entire esophagus.      The Z-line was irregular and was found 35 cm from the incisors.      A 2 cm hiatal hernia was present.      Patchy mildly erythematous mucosa without bleeding was found in the       entire examined stomach. Biopsies were taken with a cold forceps for       histology and Helicobacter pylori testing.      No gross lesions were noted in the duodenal bulb, in the first portion       of the duodenum and in the second portion of the duodenum. Biopsies for       histology were taken with a cold forceps for evaluation of celiac       disease. Impression:               - No gross lesions in esophagus. Z-line irregular,                            35 cm from the incisors.                           - 2 cm hiatal hernia.                           -  Erythematous mucosa in the stomach. Biopsied.                           - No gross lesions in the duodenal bulb, in the                            first portion of the duodenum and in the second                            portion of the duodenum. Biopsied. Moderate Sedation:      Not Applicable - Patient had care per Anesthesia. Recommendation:           - Proceed to scheduled colonoscopy.                           - Continue present medications.                           - Await pathology results.                           - The findings and recommendations were discussed                             with the patient.                           - The findings and recommendations were discussed                            with the patient's family. Procedure Code(s):        --- Professional ---                           [phone], Esophagogastroduodenoscopy, flexible,                            transoral; with biopsy, single or multiple Diagnosis Code(s):        --- Professional ---                           K22.8, Other specified diseases of esophagus                           K44.9, Diaphragmatic hernia without obstruction or                            gangrene                           K31.89, Other diseases of stomach and duodenum                           D50.9, Iron deficiency anemia, unspecified CPT copyright 2019 American Medical Association. All rights reserved. The codes documented in this report are preliminary and upon coder review may  be revised to meet current compliance  requirements. Justice Britain, MD 04/10/2021 10:33:22 AM Number of Addenda: 0

## 2021-04-10 NOTE — Anesthesia Procedure Notes (Signed)
Procedure Name: MAC Date/Time: 04/10/2021 8:32 AM Performed by: Lollie Sails, CRNA Pre-anesthesia Checklist: Patient identified, Emergency Drugs available, Suction available, Patient being monitored and Timeout performed Oxygen Delivery Method: Simple face mask Preoxygenation: POM used. Placement Confirmation: positive ETCO2

## 2021-04-10 NOTE — Transfer of Care (Signed)
Immediate Anesthesia Transfer of Care Note  Patient: Jacqueline Watkins  Procedure(s) Performed: COLONOSCOPY WITH PROPOFOL ENDOSCOPIC MUCOSAL RESECTION ESOPHAGOGASTRODUODENOSCOPY (EGD) WITH PROPOFOL BIOPSY HOT HEMOSTASIS (ARGON PLASMA COAGULATION/BICAP) HEMOSTASIS CLIP PLACEMENT POLYPECTOMY  Patient Location: PACU and Endoscopy Unit  Anesthesia Type:MAC  Level of Consciousness: awake, alert  and patient cooperative  Airway & Oxygen Therapy: Patient Spontanous Breathing and Patient connected to face mask oxygen  Post-op Assessment: Report given to RN and Post -op Vital signs reviewed and stable  Post vital signs: Reviewed and stable  Last Vitals:  Vitals Value Taken Time  BP    Temp    Pulse 67 04/10/21 1021  Resp 13 04/10/21 1021  SpO2 100 % 04/10/21 1021  Vitals shown include unvalidated device data.  Last Pain:  Vitals:   04/10/21 0740  TempSrc: Oral         Complications: No notable events documented.

## 2021-04-10 NOTE — Anesthesia Postprocedure Evaluation (Signed)
Anesthesia Post Note  Patient: Jacqueline Watkins  Procedure(s) Performed: COLONOSCOPY WITH PROPOFOL ENDOSCOPIC MUCOSAL RESECTION ESOPHAGOGASTRODUODENOSCOPY (EGD) WITH PROPOFOL BIOPSY HOT HEMOSTASIS (ARGON PLASMA COAGULATION/BICAP) HEMOSTASIS CLIP PLACEMENT POLYPECTOMY     Patient location during evaluation: PACU Anesthesia Type: MAC Level of consciousness: awake and alert Pain management: pain level controlled Vital Signs Assessment: post-procedure vital signs reviewed and stable Respiratory status: spontaneous breathing, nonlabored ventilation, respiratory function stable and patient connected to nasal cannula oxygen Cardiovascular status: stable and blood pressure returned to baseline Postop Assessment: no apparent nausea or vomiting Anesthetic complications: no   No notable events documented.  Last Vitals:  Vitals:   04/10/21 1200 04/10/21 1210  BP: 111/69 111/66  Pulse: 65 76  Resp: 19 19  Temp:    SpO2: 100% 100%    Last Pain:  Vitals:   04/10/21 1210  TempSrc:   PainSc: 0-No pain                 Tiajuana Amass

## 2021-04-10 NOTE — Op Note (Signed)
[facility] Patient Name: Jacqueline Watkins Procedure Date: 04/10/2021 MRN: [id number] Attending MD: Justice Britain , MD Date of Birth: February 03, 1986 CSN: [id number] Age: 35 Admit Type: Outpatient Procedure:                Colonoscopy Indications:              Excision of colonic polyp Providers:                Justice Britain, MD, Baird Cancer, RN, Elspeth Cho Tech., Technician, Edman Circle. Zenia Resides CRNA,                            CRNA Referring MD:             Pricilla Riffle. Fuller Plan, MD, Alonza Bogus PA, PA Medicines:                Monitored Anesthesia Care Complications:            No immediate complications. Estimated Blood Loss:     Estimated blood loss was minimal. Procedure:                Pre-Anesthesia Assessment:                           - Prior to the procedure, a History and Physical                            was performed, and patient medications and                            allergies were reviewed. The patient's tolerance of                            previous anesthesia was also reviewed. The risks                            and benefits of the procedure and the sedation                            options and risks were discussed with the patient.                            All questions were answered, and informed consent                            was obtained. Prior Anticoagulants: The patient has                            taken no previous anticoagulant or antiplatelet                            agents. ASA Grade Assessment: II - A patient with  mild systemic disease. After reviewing the risks                            and benefits, the patient was deemed in                            satisfactory condition to undergo the procedure.                           After obtaining informed consent, the colonoscope                            was passed under direct vision. Throughout the                             procedure, the patient's blood pressure, pulse, and                            oxygen saturations were monitored continuously. The                            [id number]-[id number] ([id number] Olympus colonoscope was                            introduced through the anus and advanced to the 5                            cm into the ileum. The colonoscopy was technically                            difficult and complex. Successful completion of the                            procedure was aided by performing the maneuvers                            documented (below) in this report. The patient                            tolerated the procedure. The quality of the bowel                            preparation was good. The terminal ileum, ileocecal                            valve, appendiceal orifice, and rectum were                            photographed. Scope In: 8:44:55 AM Scope Out: 10:12:51 AM Scope Withdrawal Time: 1 hour 25 minutes 24 seconds  Total Procedure Duration: 1 hour 27 minutes 56 seconds  Findings:      The digital rectal exam findings include hemorrhoids. Pertinent       negatives include no palpable rectal lesions.  The terminal ileum and ileocecal valve appeared normal.      The exam was otherwise without abnormality.      A 45 mm polyp was found in the distal ascending colon. The polyp was       semi-sessile. Preparations were made for mucosal resection. NBI imaging       and White-light endoscopy was done to mark the borders of the lesion.       Orise gel was injected to raise the lesion. Piecemeal mucosal resection       using a snare was performed. Resection and retrieval were complete.       Coagulation for tissue destruction using snare tip soft coagulation to       the margin was successful. To prevent bleeding after mucosal resection,       seven hemostatic clips were successfully placed (MR conditional). There       was no bleeding at the end of the  procedure.      A 4 mm polyp was found in the descending colon. The polyp was sessile.       The polyp was removed with a cold snare. Resection and retrieval were       complete.      Normal mucosa was found in the entire colon otherwise.      Non-bleeding non-thrombosed external and internal hemorrhoids were found       during retroflexion, during perianal exam and during digital exam. The       hemorrhoids were Grade III (internal hemorrhoids that prolapse but       require manual reduction). Impression:               - Hemorrhoids found on digital rectal exam.                           - The examined portion of the ileum was normal.                           - The examination was otherwise normal.                           - One 45 mm polyp in the distal ascending colon,                            removed with mucosal resection. Resected and                            retrieved. Treated with STSC to margin. Clips (MR                            conditional) were placed.                           - One 4 mm polyp in the descending colon, removed                            with a cold snare. Resected and retrieved.                           - Normal mucosa  in the entire examined colon                            otherwise.                           - Non-bleeding non-thrombosed external and internal                            hemorrhoids. Moderate Sedation:      Not Applicable - Patient had care per Anesthesia. Recommendation:           - The patient will be observed post-procedure,                            until all discharge criteria are met.                           - Discharge patient to home.                           - Patient has a contact number available for                            emergencies. The signs and symptoms of potential                            delayed complications were discussed with the                            patient. Return to normal activities  tomorrow.                            Written discharge instructions were provided to the                            patient.                           - High fiber diet.                           - Use FiberCon 1-2 tablets PO daily.                           - Continue present medications.                           - Await pathology results.                           - Repeat colonoscopy in 6 months for surveillance.                           - The findings and recommendations were discussed  with the patient.                           - The findings and recommendations were discussed                            with the patient's family. Procedure Code(s):        --- Professional ---                           [phone], Colonoscopy, flexible; with endoscopic                            mucosal resection                           45385, 47, Colonoscopy, flexible; with removal of                            tumor(s), polyp(s), or other lesion(s) by snare                            technique Diagnosis Code(s):        --- Professional ---                           K64.2, Third degree hemorrhoids                           K63.5, Polyp of colon CPT copyright 2019 American Medical Association. All rights reserved. The codes documented in this report are preliminary and upon coder review may  be revised to meet current compliance requirements. Justice Britain, MD 04/10/2021 10:41:12 AM Number of Addenda: 0

## 2021-04-10 NOTE — Anesthesia Preprocedure Evaluation (Addendum)
Anesthesia Evaluation  Patient identified by MRN, date of birth, ID band Patient awake    Reviewed: Allergy & Precautions, NPO status , Patient's Chart, lab work & pertinent test results  Airway Mallampati: II  TM Distance: >3 FB     Dental   Pulmonary neg pulmonary ROS,    breath sounds clear to auscultation       Cardiovascular negative cardio ROS   Rhythm:Regular Rate:Normal     Neuro/Psych negative neurological ROS     GI/Hepatic Neg liver ROS, Colon polyp and mass   Endo/Other  negative endocrine ROS  Renal/GU Renal disease     Musculoskeletal   Abdominal   Peds  Hematology  (+) anemia ,   Anesthesia Other Findings   Reproductive/Obstetrics                             Anesthesia Physical Anesthesia Plan  ASA: 2  Anesthesia Plan: MAC   Post-op Pain Management:    Induction:   PONV Risk Score and Plan: 2 and Propofol infusion, Ondansetron and Treatment may vary due to age or medical condition  Airway Management Planned: Natural Airway, Simple Face Mask and Nasal Cannula  Additional Equipment:   Intra-op Plan:   Post-operative Plan:   Informed Consent: I have reviewed the patients History and Physical, chart, labs and discussed the procedure including the risks, benefits and alternatives for the proposed anesthesia with the patient or authorized representative who has indicated his/her understanding and acceptance.       Plan Discussed with:   Anesthesia Plan Comments:         Anesthesia Quick Evaluation

## 2021-04-11 ENCOUNTER — Encounter (HOSPITAL_COMMUNITY): Payer: Self-pay | Admitting: Gastroenterology

## 2021-04-11 LAB — SURGICAL PATHOLOGY

## 2021-04-12 ENCOUNTER — Encounter: Payer: Self-pay | Admitting: Gastroenterology

## 2021-04-17 ENCOUNTER — Other Ambulatory Visit: Payer: Self-pay

## 2021-04-17 MED ORDER — AMOXICILLIN 500 MG PO TABS: 1000.0000 mg | ORAL_TABLET | Freq: Two times a day (BID) | ORAL | 0 refills | Status: AC

## 2021-04-17 MED ORDER — OMEPRAZOLE 40 MG PO CPDR: 40.0000 mg | DELAYED_RELEASE_CAPSULE | Freq: Two times a day (BID) | ORAL | 0 refills | Status: DC

## 2021-04-17 MED ORDER — CLARITHROMYCIN 500 MG PO TABS: 500.0000 mg | ORAL_TABLET | Freq: Two times a day (BID) | ORAL | 0 refills | Status: AC

## 2021-04-17 MED ORDER — METRONIDAZOLE 500 MG PO TABS: 500.0000 mg | ORAL_TABLET | Freq: Two times a day (BID) | ORAL | 0 refills | Status: AC

## 2021-05-24 ENCOUNTER — Ambulatory Visit: Payer: Self-pay | Admitting: Gastroenterology

## 2023-02-23 ENCOUNTER — Inpatient Hospital Stay (HOSPITAL_COMMUNITY): Payer: Self-pay

## 2023-02-23 ENCOUNTER — Inpatient Hospital Stay (HOSPITAL_COMMUNITY)
Admission: AD | Admit: 2023-02-23 | Discharge: 2023-02-23 | Disposition: A | Discharge status: Home / Self Care | Payer: Self-pay | Attending: Obstetrics and Gynecology | Admitting: Obstetrics and Gynecology

## 2023-02-23 ENCOUNTER — Encounter (HOSPITAL_COMMUNITY): Payer: Self-pay | Admitting: *Deleted

## 2023-02-23 DIAGNOSIS — R1011 Right upper quadrant pain: Secondary | ICD-10-CM | POA: Insufficient documentation

## 2023-02-23 DIAGNOSIS — O26891 Other specified pregnancy related conditions, first trimester: Secondary | ICD-10-CM | POA: Insufficient documentation

## 2023-02-23 DIAGNOSIS — O219 Vomiting of pregnancy, unspecified: Secondary | ICD-10-CM

## 2023-02-23 DIAGNOSIS — Z3A10 10 weeks gestation of pregnancy: Secondary | ICD-10-CM | POA: Insufficient documentation

## 2023-02-23 LAB — URINALYSIS, ROUTINE W REFLEX MICROSCOPIC
Bilirubin Urine: NEGATIVE
Glucose, UA: NEGATIVE mg/dL
Hgb urine dipstick: NEGATIVE
Ketones, ur: NEGATIVE mg/dL
Nitrite: NEGATIVE
Protein, ur: NEGATIVE mg/dL
Specific Gravity, Urine: 1.005 (ref 1.005–1.030)
pH: 7 (ref 5.0–8.0)

## 2023-02-23 LAB — COMPREHENSIVE METABOLIC PANEL
ALT: 19 U/L (ref 0–44)
AST: 17 U/L (ref 15–41)
Albumin: 3.3 g/dL — ABNORMAL LOW (ref 3.5–5.0)
Alkaline Phosphatase: 59 U/L (ref 38–126)
Anion gap: 14 (ref 5–15)
BUN: 6 mg/dL (ref 6–20)
CO2: 18 mmol/L — ABNORMAL LOW (ref 22–32)
Calcium: 9 mg/dL (ref 8.9–10.3)
Chloride: 105 mmol/L (ref 98–111)
Creatinine, Ser: 0.64 mg/dL (ref 0.44–1.00)
GFR, Estimated: >60 mL/min (ref >60)
Glucose, Bld: 73 mg/dL (ref 70–99)
Potassium: 3.3 mmol/L — ABNORMAL LOW (ref 3.5–5.1)
Sodium: 137 mmol/L (ref 135–145)
Total Bilirubin: 0.3 mg/dL (ref 0.3–1.2)
Total Protein: 6.7 g/dL (ref 6.5–8.1)

## 2023-02-23 LAB — CBC WITH DIFFERENTIAL/PLATELET
Abs Immature Granulocytes: 0.03 10*3/uL (ref 0.00–0.07)
Basophils Absolute: 0 10*3/uL (ref 0.0–0.1)
Basophils Relative: 0 %
Eosinophils Absolute: 0.1 10*3/uL (ref 0.0–0.5)
Eosinophils Relative: 1 %
HCT: 36.1 % (ref 36.0–46.0)
Hemoglobin: 11.3 g/dL — ABNORMAL LOW (ref 12.0–15.0)
Immature Granulocytes: 0 %
Lymphocytes Relative: 19 %
Lymphs Abs: 2 10*3/uL (ref 0.7–4.0)
MCH: 24.9 pg — ABNORMAL LOW (ref 26.0–34.0)
MCHC: 31.3 g/dL (ref 30.0–36.0)
MCV: 79.5 fL — ABNORMAL LOW (ref 80.0–100.0)
Monocytes Absolute: 0.7 10*3/uL (ref 0.1–1.0)
Monocytes Relative: 7 %
Neutro Abs: 7.4 10*3/uL (ref 1.7–7.7)
Neutrophils Relative %: 73 %
Platelets: 255 10*3/uL (ref 150–400)
RBC: 4.54 MIL/uL (ref 3.87–5.11)
RDW: 18.2 % — ABNORMAL HIGH (ref 11.5–15.5)
WBC: 10.2 10*3/uL (ref 4.0–10.5)
nRBC: 0 % (ref 0.0–0.2)

## 2023-02-23 MED ORDER — LACTATED RINGERS IV BOLUS
1000.0000 mL | Freq: Once | INTRAVENOUS | Status: AC
  Administered 2023-02-23: 1000 mL via INTRAVENOUS

## 2023-02-23 MED ORDER — HYDROMORPHONE HCL 1 MG/ML IJ SOLN
1.0000 mg | Freq: Once | INTRAMUSCULAR | Status: AC
  Administered 2023-02-23: 1 mg via INTRAVENOUS
  Filled 2023-02-23: qty 1

## 2023-02-23 MED ORDER — ONDANSETRON HCL 4 MG/2ML IJ SOLN
4.0000 mg | Freq: Once | INTRAMUSCULAR | Status: AC
  Administered 2023-02-23: 4 mg via INTRAVENOUS
  Filled 2023-02-23: qty 2

## 2023-02-23 NOTE — MAU Provider Note (Signed)
Chief Complaint:  Abdominal Pain, Nausea, Emesis, and Headache  HPI   Jacqueline Watkins is a 37 y.o. [id number] at [redacted]w[redacted]d who presents to maternity admissions reporting right sided upper abdominal pain with nausea since this morning as well as a headache. She has not taken anything for the pain, denies vomiting. Has a history of this complaint for >7mo prior to pregnancy, says this feels like when she had gallstones. No other physical complaints, no pregnancy related complaints.   Pregnancy Course: Will receive prenatal care at [facility] as she has for past pregnancies.  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   Microcytic anemia 09/11/2018   Pyelonephritis    Sepsis due to urinary tract infection (HCC) 09/11/2018   OB History  Gravida Para Term Preterm AB Living  5 3 3  0 1 3  SAB IAB Ectopic Multiple Live Births  1 0 0 0 3    # Outcome Date GA Lbr Len/2nd Weight Sex Delivery Anes PTL Lv  5 Current           4 SAB 11/04/20 [redacted]w[redacted]d   M SAB None Y FD     Complications: Preterm premature rupture of membranes (PPROM) with onset of labor within 24 hours of rupture in second trimester, antepartum  3 Term 06/01/11 [redacted]w[redacted]d 01:50 / 00:11 5 lb 11.2 oz (2.586 kg) M VBAC None  LIV  2 Term      VBAC   LIV  1 Term      CS-LTranv   LIV   Past Surgical History:  Procedure Laterality Date   BIOPSY  04/10/2021   Procedure: BIOPSY;  Surgeon: Lemar Lofty., MD;  Location: [facility];  Service: Gastroenterology;;   CESAREAN SECTION  2005   COLONOSCOPY     COLONOSCOPY WITH PROPOFOL N/A 04/10/2021   Procedure: COLONOSCOPY WITH PROPOFOL;  Surgeon: Lemar Lofty., MD;  Location: [facility];  Service: Gastroenterology;  Laterality: N/A;   ENDOSCOPIC MUCOSAL RESECTION N/A 04/10/2021   Procedure: ENDOSCOPIC MUCOSAL RESECTION;  Surgeon: Meridee Score Netty Starring., MD;  Location: [facility];  Service: Gastroenterology;  Laterality: N/A;   ESOPHAGOGASTRODUODENOSCOPY  (EGD) WITH PROPOFOL N/A 04/10/2021   Procedure: ESOPHAGOGASTRODUODENOSCOPY (EGD) WITH PROPOFOL;  Surgeon: Meridee Score Netty Starring., MD;  Location: [facility];  Service: Gastroenterology;  Laterality: N/A;   HEMOSTASIS CLIP PLACEMENT  04/10/2021   Procedure: HEMOSTASIS CLIP PLACEMENT;  Surgeon: Lemar Lofty., MD;  Location: [facility];  Service: Gastroenterology;;   HOT HEMOSTASIS N/A 04/10/2021   Procedure: HOT HEMOSTASIS (ARGON PLASMA COAGULATION/BICAP);  Surgeon: Lemar Lofty., MD;  Location: [facility];  Service: Gastroenterology;  Laterality: N/A;   POLYPECTOMY  04/10/2021   Procedure: POLYPECTOMY;  Surgeon: Lemar Lofty., MD;  Location: [facility];  Service: Gastroenterology;;   Family History  Problem Relation Age of Onset   Hypertension Mother    Colon cancer Neg Hx    Esophageal cancer Neg Hx    Inflammatory bowel disease Neg Hx    Liver disease Neg Hx    Pancreatic cancer Neg Hx    Rectal cancer Neg Hx    Stomach cancer Neg Hx    Social History   Tobacco Use   Smoking status: Never   Smokeless tobacco: Never  Vaping Use   Vaping Use: Never used  Substance Use Topics   Alcohol use: No   Drug use: No   No Known Allergies No medications prior to admission.   I have reviewed patient's Past Medical Hx, Surgical  Hx, Family Hx, Social Hx, medications and allergies.   ROS  Pertinent items noted in HPI and remainder of comprehensive ROS otherwise negative.   PHYSICAL EXAM  Patient Vitals for the past 24 hrs:  BP Temp Pulse Resp  02/23/23 1903 112/62 -- 79 --  02/23/23 1406 126/71 98.1 F (36.7 C) 86 18   Constitutional: Well-developed, well-nourished female in no acute distress.  Cardiovascular: normal rate & rhythm, warm and well-perfused Respiratory: normal effort, no problems with respiration noted GI: Abd soft, tender to upper right quadrant, non-distended MS: Extremities nontender, no edema, normal ROM Neurologic: Alert and  oriented x 4.  GU: no CVA tenderness Pelvic: exam deferred   Labs: Results for orders placed or performed during the hospital encounter of 02/23/23 (from the past 24 hour(s))  Urinalysis, Routine w reflex microscopic -Urine, Clean Catch     Status: Abnormal   Collection Time: 02/23/23  2:28 PM  Result Value Ref Range   Color, Urine STRAW (A) YELLOW   APPearance CLEAR CLEAR   Specific Gravity, Urine 1.005 1.005 - 1.030   pH 7.0 5.0 - 8.0   Glucose, UA NEGATIVE NEGATIVE mg/dL   Hgb urine dipstick NEGATIVE NEGATIVE   Bilirubin Urine NEGATIVE NEGATIVE   Ketones, ur NEGATIVE NEGATIVE mg/dL   Protein, ur NEGATIVE NEGATIVE mg/dL   Nitrite NEGATIVE NEGATIVE   Leukocytes,Ua MODERATE (A) NEGATIVE   RBC / HPF 0-5 0 - 5 RBC/hpf   WBC, UA 11-20 0 - 5 WBC/hpf   Bacteria, UA RARE (A) NONE SEEN   Squamous Epithelial / HPF 0-5 0 - 5 /HPF   Mucus PRESENT    Sperm, UA PRESENT   CBC with Differential/Platelet     Status: Abnormal   Collection Time: 02/23/23  3:22 PM  Result Value Ref Range   WBC 10.2 4.0 - 10.5 K/uL   RBC 4.54 3.87 - 5.11 MIL/uL   Hemoglobin 11.3 (L) 12.0 - 15.0 g/dL   HCT 57.8 46.9 - 62.9 %   MCV 79.5 (L) 80.0 - 100.0 fL   MCH 24.9 (L) 26.0 - 34.0 pg   MCHC 31.3 30.0 - 36.0 g/dL   RDW 52.8 (H) 41.3 - 24.4 %   Platelets 255 150 - 400 K/uL   nRBC 0.0 0.0 - 0.2 %   Neutrophils Relative % 73 %   Neutro Abs 7.4 1.7 - 7.7 K/uL   Lymphocytes Relative 19 %   Lymphs Abs 2.0 0.7 - 4.0 K/uL   Monocytes Relative 7 %   Monocytes Absolute 0.7 0.1 - 1.0 K/uL   Eosinophils Relative 1 %   Eosinophils Absolute 0.1 0.0 - 0.5 K/uL   Basophils Relative 0 %   Basophils Absolute 0.0 0.0 - 0.1 K/uL   Immature Granulocytes 0 %   Abs Immature Granulocytes 0.03 0.00 - 0.07 K/uL  Comprehensive metabolic panel     Status: Abnormal   Collection Time: 02/23/23  3:22 PM  Result Value Ref Range   Sodium 137 135 - 145 mmol/L   Potassium 3.3 (L) 3.5 - 5.1 mmol/L   Chloride 105 98 - 111 mmol/L    CO2 18 (L) 22 - 32 mmol/L   Glucose, Bld 73 70 - 99 mg/dL   BUN 6 6 - 20 mg/dL   Creatinine, Ser 0.10 0.44 - 1.00 mg/dL   Calcium 9.0 8.9 - 27.2 mg/dL   Total Protein 6.7 6.5 - 8.1 g/dL   Albumin 3.3 (L) 3.5 - 5.0 g/dL   AST 17 15 -  41 U/L   ALT 19 0 - 44 U/L   Alkaline Phosphatase 59 38 - 126 U/L   Total Bilirubin 0.3 0.3 - 1.2 mg/dL   GFR, Estimated >16 >10 mL/min   Anion gap 14 5 - 15   Imaging:  US Abdomen Limited RUQ (LIVER/GB)  Result Date: 02/23/2023 CLINICAL DATA:  Right upper quadrant pain, nausea and vomiting, history of medullary nephrocalcinosis EXAM: ULTRASOUND ABDOMEN LIMITED RIGHT UPPER QUADRANT COMPARISON:  CT abdomen and pelvis Dec 26, 2020 FINDINGS: Gallbladder: No gallstones or wall thickening visualized. No sonographic Murphy sign noted by sonographer. Common bile duct: Diameter: 6 mm Liver: No focal lesion identified. Within normal limits in parenchymal echogenicity. Portal vein is patent on color Doppler imaging with normal direction of blood flow towards the liver. Other: Bilateral medullary nephrocalcinosis incidentally visualized, better assessed on prior CT. IMPRESSION: 1. No etiology for right upper quadrant pain is identified. 2. Bilateral medullary nephrocalcinosis. Electronically Signed   By: Jacob Moores M.D.   On: 02/23/2023 18:25    MDM & MAU COURSE  MDM: Moderate  MAU Course: Orders Placed This Encounter  Procedures   US Abdomen Limited RUQ (LIVER/GB)   Urinalysis, Routine w reflex microscopic -Urine, Clean Catch   CBC with Differential/Platelet   Comprehensive metabolic panel   Discharge patient   Meds ordered this encounter  Medications   lactated ringers bolus 1,000 mL   HYDROmorphone (DILAUDID) injection 1 mg   ondansetron (ZOFRAN) injection 4 mg   - Suspect gallstones or cholecystitis, LR bolus + dilaudid and zofran given. Pt sent to U/S - Known nephrocalcinosis seen, gallbladder and other structures normal. - Pt improved once back from  U/S. Labs normal. Pt stable for discharge home.   ASSESSMENT   1. Right upper quadrant pain   2. Nausea and vomiting in pregnancy   3. [redacted] weeks gestation of pregnancy    PLAN  Discharge home in stable condition with return precautions and information on healthy eating.     Follow-up Information     Department, [facility] Follow up.   Why: as scheduled for ongoing prenatal care Contact information: [address] [place] [address] [phone]                 Allergies as of 02/23/2023   No Known Allergies      Medication List     STOP taking these medications    ferrous sulfate 325 (65 FE) MG tablet Commonly known as: FerrouSul   multivitamin with minerals tablet   NATURAL FIBER LAXATIVE PO   omeprazole 40 MG capsule Commonly known as: PRILOSEC       TAKE these medications    prenatal multivitamin Tabs tablet Take 1 tablet by mouth daily at 12 noon.       Edd Arbour, CNM, MSN, IBCLC Certified Nurse Midwife, [facility]

## 2023-02-23 NOTE — MAU Note (Signed)
.  Jacqueline Watkins is a 37 y.o. at Unknown here in MAU reporting: started having pain in upper right abd today. Having  a headache  and N/V as well.  Denies any vag bleeding or discharge. Has pregnancy confirmation from [facility]. Pt stated she has had this same pain before off and on for about 6 month prior to pregnancy . Has just gotten worse. LMP: 12/13/22 Onset of complaint: today Pain score: 8 Vitals:   02/23/23 1406  BP: 126/71  Pulse: 86  Resp: 18  Temp: 98.1 F (36.7 C)     FHT:n/a Lab orders placed from triage:   u/a

## 2023-03-11 LAB — OB RESULTS CONSOLE RUBELLA ANTIBODY, IGM: Rubella: IMMUNE

## 2023-03-11 LAB — OB RESULTS CONSOLE RPR: RPR: NONREACTIVE

## 2023-03-11 LAB — OB RESULTS CONSOLE HEPATITIS B SURFACE ANTIGEN: Hepatitis B Surface Ag: NEGATIVE

## 2023-03-11 LAB — OB RESULTS CONSOLE GC/CHLAMYDIA
Chlamydia: NEGATIVE
Neisseria Gonorrhea: NEGATIVE

## 2023-03-11 LAB — OB RESULTS CONSOLE HGB/HCT, BLOOD
HCT: 37 (ref 29–41)
Hemoglobin: 12.3

## 2023-03-11 LAB — OB RESULTS CONSOLE HIV ANTIBODY (ROUTINE TESTING): HIV: NONREACTIVE

## 2023-03-11 LAB — OB RESULTS CONSOLE ANTIBODY SCREEN: Antibody Screen: NEGATIVE

## 2023-03-11 LAB — OB RESULTS CONSOLE PLATELET COUNT: Platelets: 233

## 2023-03-11 LAB — HEPATITIS C ANTIBODY: HCV Ab: NEGATIVE

## 2023-03-11 LAB — OB RESULTS CONSOLE ABO/RH: RH Type: POSITIVE

## 2023-03-11 LAB — OB RESULTS CONSOLE VARICELLA ZOSTER ANTIBODY, IGG: Varicella: IMMUNE

## 2023-03-13 ENCOUNTER — Other Ambulatory Visit: Payer: Self-pay | Admitting: Obstetrics & Gynecology

## 2023-03-13 DIAGNOSIS — Z36 Encounter for antenatal screening for chromosomal anomalies: Secondary | ICD-10-CM

## 2023-03-18 DIAGNOSIS — Z98891 History of uterine scar from previous surgery: Secondary | ICD-10-CM | POA: Insufficient documentation

## 2023-03-18 DIAGNOSIS — O34219 Maternal care for unspecified type scar from previous cesarean delivery: Secondary | ICD-10-CM

## 2023-03-18 DIAGNOSIS — O24419 Gestational diabetes mellitus in pregnancy, unspecified control: Secondary | ICD-10-CM

## 2023-03-18 DIAGNOSIS — O0990 Supervision of high risk pregnancy, unspecified, unspecified trimester: Secondary | ICD-10-CM | POA: Insufficient documentation

## 2023-03-21 ENCOUNTER — Other Ambulatory Visit: Payer: Self-pay

## 2023-03-25 ENCOUNTER — Ambulatory Visit: Payer: Self-pay | Attending: Obstetrics and Gynecology

## 2023-03-25 DIAGNOSIS — O09522 Supervision of elderly multigravida, second trimester: Secondary | ICD-10-CM

## 2023-03-26 ENCOUNTER — Other Ambulatory Visit: Payer: Self-pay | Admitting: Advanced Practice Midwife

## 2023-03-26 ENCOUNTER — Ambulatory Visit (INDEPENDENT_AMBULATORY_CARE_PROVIDER_SITE_OTHER): Payer: Self-pay | Admitting: Advanced Practice Midwife

## 2023-03-26 ENCOUNTER — Other Ambulatory Visit: Payer: Self-pay

## 2023-03-26 ENCOUNTER — Encounter: Payer: Self-pay | Admitting: Advanced Practice Midwife

## 2023-03-26 VITALS — BP 116/79 | HR 101 | Wt 169.7 lb

## 2023-03-26 DIAGNOSIS — O24419 Gestational diabetes mellitus in pregnancy, unspecified control: Secondary | ICD-10-CM

## 2023-03-26 DIAGNOSIS — O0992 Supervision of high risk pregnancy, unspecified, second trimester: Secondary | ICD-10-CM

## 2023-03-26 DIAGNOSIS — Z87448 Personal history of other diseases of urinary system: Secondary | ICD-10-CM

## 2023-03-26 DIAGNOSIS — O34219 Maternal care for unspecified type scar from previous cesarean delivery: Secondary | ICD-10-CM

## 2023-03-26 DIAGNOSIS — O09522 Supervision of elderly multigravida, second trimester: Secondary | ICD-10-CM

## 2023-03-26 DIAGNOSIS — Z3A14 14 weeks gestation of pregnancy: Secondary | ICD-10-CM

## 2023-03-26 DIAGNOSIS — O0990 Supervision of high risk pregnancy, unspecified, unspecified trimester: Secondary | ICD-10-CM

## 2023-03-26 DIAGNOSIS — Z98891 History of uterine scar from previous surgery: Secondary | ICD-10-CM

## 2023-03-26 MED ORDER — ACCU-CHEK GUIDE VI STRP
ORAL_STRIP | 12 refills | Status: DC
Start: 2023-03-26 — End: 2023-09-21

## 2023-03-26 MED ORDER — ACCU-CHEK GUIDE W/DEVICE KIT
1.0000 | PACK | 0 refills | Status: DC
Start: 2023-03-26 — End: 2023-09-21

## 2023-03-26 MED ORDER — ACCU-CHEK SOFTCLIX LANCETS MISC
12 refills | Status: DC
Start: 2023-03-26 — End: 2023-09-21

## 2023-03-26 MED ORDER — ASPIRIN 81 MG PO TBEC
81.0000 mg | DELAYED_RELEASE_TABLET | Freq: Every day | ORAL | 2 refills | Status: DC
Start: 2023-03-26 — End: 2023-09-21

## 2023-03-26 NOTE — Progress Notes (Signed)
PRENATAL VISIT NOTE  Subjective:  Jacqueline Watkins is a 37 y.o. [phone] at [redacted]w[redacted]d being seen today for ongoing prenatal care. She is transferring care from [facility] 2/2 new Dx GDM. Has not starting checking CBGS. Has not seen DM educator yet.  She is currently monitored for the following issues for this high-risk pregnancy and has Abnormal abdominal CT scan; LLQ abdominal pain; Constipation; Adenomatous polyp of ascending colon; Abnormal colonoscopy; Abnormal CT scan, colon; Supervision of high risk pregnancy, antepartum; Delivery with history of C-section; History of VBAC x2; and GDM (gestational diabetes mellitus) on their problem list.  History of C-section due to PPROM at 32 wks per pt. [place] No records available.  Patient reports no complaints.  Contractions: Not present. Vag. Bleeding: None.  Movement: Absent. Denies leaking of fluid.   The following portions of the patient's history were reviewed and updated as appropriate: allergies, current medications, past family history, past medical history, past social history, past surgical history and problem list.   Objective:   Vitals:   03/26/23 1432  BP: 116/79  Pulse: (!) 101  Weight: 169 lb 11.2 oz (77 kg)    Fetal Status: Fetal Heart Rate (bpm): 156   Movement: Absent     General:  Alert, oriented and cooperative. Patient is in no acute distress.  Skin: Skin is warm and dry. No rash noted.   Cardiovascular: Normal heart rate noted  Respiratory: Normal respiratory effort, no problems with respiration noted  Abdomen: Soft, gravid, appropriate for gestational age.  Pain/Pressure: Absent     Pelvic: Cervical exam deferred        Extremities: Normal range of motion.  Edema: None  Mental Status: Normal mood and affect. Normal behavior. Normal judgment and thought content.   Assessment and Plan:  Pregnancy: [id number] at [redacted]w[redacted]d 1. Supervision of high risk pregnancy, antepartum - Routine High  risk OB care  2. Gestational diabetes mellitus (GDM) in second trimester, gestational diabetes method of control unspecified - Reschedule DM education  - glucose blood (ACCU-CHEK GUIDE) test strip; Use as instructed  Dispense: 100 each; Refill: 12 - Accu-Chek Softclix Lancets lancets; Use as instructed  Dispense: 100 each; Refill: 12 - Blood Glucose Monitoring Suppl (ACCU-CHEK GUIDE) w/Device KIT; 1 Device by Does not apply route as directed.  Dispense: 1 kit; Refill: 0 - Antenatal testing per MFM  3. AMA (advanced maternal age) multigravida 35+, second trimester  - aspirin EC 81 MG tablet; Take 1 tablet (81 mg total) by mouth daily. Take after 12 weeks for prevention of preeclampssia later in pregnancy  Dispense: 300 tablet; Refill: 2  4. History of VBAC x2 - Plans TOLAC  5. Delivery with history of C-section due to PPROM at 32 wks per pt. Delivered in [place] No records available. - Plans TOLAC - Needs MD visit for consent  Hx preterm delivery--PPROM - PTL precautions.   Hx UTI, P[place]o - Urine culture  Language Barrier - Spanish interpreter used.   Preterm labor symptoms and general obstetric precautions including but not limited to vaginal bleeding, contractions, leaking of fluid and fetal movement were reviewed in detail with the patient. Please refer to After Visit Summary for other counseling recommendations.   Return in about 2 weeks (around 04/09/2023) for [facility] to review blood sugars.  Future Appointments  Date Time Provider Department Center  04/17/2023  9:15 AM [facility]  04/18/2023  3:15 PM [facility]  04/25/2023  9:15  AM [facility]URSE [facility]  04/25/2023  9:30 AM [facility] US2 [facility]    Dorathy Kinsman, CNM

## 2023-03-26 NOTE — Patient Instructions (Signed)

## 2023-03-28 NOTE — Progress Notes (Signed)
[facility] at [facility] [address] Phone:  [phone]   Fax:  [phone]    Name: Jacqueline Watkins Indication: AMA  DOB: 1986-01-07 Age: 37 y.o.   EDD: 09/19/2023 LMP: 12/13/2022 Referring Provider:  Jaynie Collins, MD  EGA: [redacted]w[redacted]d Genetic Counselor: Sheppard Plumber, MS, GC  OB Hx: [id number] Date of Appointment: 03/25/2023  Accompanied by: Chyrl Civatte (Spanish interpreter) Face to Face Time: 30 Minutes   Previous Testing Completed:  Ms. [name] previously completed cell-free DNA screening (cfDNA) in this pregnancy. The result is low risk, consistent with a female fetus. This screening significantly reduces but does not eliminate the chance that the current pregnancy has Down syndrome (trisomy 59), trisomy 58, trisomy 57, and common sex chromosome conditions, and 22q11.2 microdeletion syndrome. Please see report for details. Additionally, there are many genetic conditions that cannot be detected by cfDNA.  Ms. [name] previously completed carrier screening. She screened to not be a carrier for cystic fibrosis (CF), spinal muscular atrophy (SMA), alpha thalassemia, and beta hemoglobinopathies. Please see report for details. A negative result on carrier screening reduces but does not eliminate the chance of being a carrier.    Pregnancy History:   This is Jacqueline Watkins's fifth pregnancy. She has three living children, one of whom was born premature at 61 weeks. She has had one early loss at 16w due to chorioamnionitis.  Personal history of gestational diabetes. She reports not taking any medications for her diabetes. Her chart showed a history of a colon adenoma that was surgically removed. Denies personal history of high blood pressure, thyroid conditions, and seizures. Denies bleeding, infections, and fevers in this pregnancy. Denies using tobacco, alcohol, or street drugs in this pregnancy.   Family History: A three-generation  pedigree was created and scanned into Epic under the Media tab.  Ms. [name] reports a family history of lupus in a niece, diabetes, hypertension, gastritis, and heart concerns. The father of the baby (FOB) has a personal history of fatty liver and herpes. He has a niece (63 yo) who was diagnosed with leukemia at 37 yo. It is unknown whether she has had any genetic testing. She is well now. FOB also has a family history of diabetes and hypertension in his mother as well liver concerns, kidney concerns, and gastritis in his father who also had a history of alcohol use. His mother's sister had a history of 5 miscarriages but did have other successful pregnancies. Maternal ethnicity reported as [place] and paternal ethnicity reported as [employer], manufacturing systems. Denies Ashkenazi Jewish ancestry. Family history not remarkable for consanguinity, individuals with birth defects, intellectual disability, autism spectrum disorder, still births, or unexplained neonatal death. We discussed that conditions such as diabetes, hypertension, and lupus are multifactorial in nature. These conditions may be affected by our genetics and the environment. Leukemia may be hereditary or multifactorial. We discussed that without any genetic testing to confirm or rule out a genetic cause to these conditions, we cannot estimate the risk to the fetus or other family members.     Genetic Counseling:   Advanced maternal age. We discussed that pregnant patients are considered to be of advanced maternal age (AMA) when they are 60 years of age or older at the time of delivery. Some of the complications associated with being AMA are increased risk of miscarriage, increased risk for the fetus to be affected with a chromosomal difference, and certain pregnancy complications such as high blood pressure and  gestational diabetes. Common chromosomal differences associated with AMA include trisomy 21 (Down syndrome), trisomy 85, and trisomy 59. The  increased risk of chromosomal differences in the fetus is due to nondisjunction of certain chromosomes during the formation of the egg cell, resulting in extra or missing chromosomes in the fetus. For Ms. [name], prior to completing any aneuploidy screening, the chance the fetus would be born with a chromosome difference is 1 in 37 based on her age at delivery. This means that there is a 122 in 123 chance the fetus would not be affected with a chromosome difference. We dicussed that this risk may be even lower since she has had low-risk cfDNA screening. We also discussed testing options that can be offered to pregnant patients to inform the chance for a chromosomal difference in the fetus. These can be seen under testing/screening options below.  Birth Defects. All babies have approximately a 3-5% risk for a birth defect and a majority of these defects cannot be detected through the screening or diagnostic testing listed. Ultrasound may detect some birth defects, but it may not detect all birth defects. About half of pregnancies with Down syndrome do not show any soft markers on ultrasound. A normal ultrasound does not guarantee a healthy pregnancy.   Testing/Screening Options:   Amniocentesis for Prenatal Diagnosis. This procedure involves the removal of a sample of the amniotic fluid surrounding the fetus with ultrasound-guided use of a thin needle inserted through the maternal abdomen and uterus. This procedure is generally performed after the 15th week of pregnancy. Depending on the indication, the sample can be used for genetic tests including karyotype, chromosomal microarray, FISH analysis, single gene and multi-gene panels, and exome/genome sequencing as well as testing for open neural tube defects. Fetal cells from amniotic fluid are directly evaluated and > 99.5% of chromosome problems and > 98% of open neural tube defects can be detected. Possible procedural difficulties and complications that can  arise include maternal fever/infection, cramping, bleeding, fluid leakage, and/or pregnancy loss. The risk for pregnancy loss with an amniocentesis is 1/500. Ms. Roschell declined amniocentesis.  Newborn Screening. The [place] Newborn Screening (NBS) program will screen all newborn babies for cystic fibrosis, spinal muscular atrophy, hemoglobinopathies, and numerous other conditions.    Patient Plan:  Proceed with: Routine prenatal care. Informed consent was obtained. All questions were answered.  Declined: Amniocentesis.   Thank you for sharing in the care of Ms. Kilian with [place].  Please do not hesitate to contact us at [phone] if you have any questions.  Sheppard Plumber, MS, GC Genetic Counselor  Genetic counseling student involved in appointment: No.

## 2023-03-29 ENCOUNTER — Encounter: Payer: Self-pay | Admitting: Advanced Practice Midwife

## 2023-04-01 ENCOUNTER — Encounter: Payer: Self-pay | Admitting: *Deleted

## 2023-04-01 DIAGNOSIS — O0990 Supervision of high risk pregnancy, unspecified, unspecified trimester: Secondary | ICD-10-CM

## 2023-04-08 ENCOUNTER — Ambulatory Visit: Payer: Self-pay

## 2023-04-09 ENCOUNTER — Other Ambulatory Visit: Payer: Self-pay

## 2023-04-09 DIAGNOSIS — O24419 Gestational diabetes mellitus in pregnancy, unspecified control: Secondary | ICD-10-CM

## 2023-04-17 ENCOUNTER — Other Ambulatory Visit: Payer: Self-pay

## 2023-04-17 ENCOUNTER — Ambulatory Visit (INDEPENDENT_AMBULATORY_CARE_PROVIDER_SITE_OTHER): Payer: Self-pay | Admitting: Advanced Practice Midwife

## 2023-04-17 VITALS — BP 115/79 | HR 101 | Wt 165.3 lb

## 2023-04-17 DIAGNOSIS — O219 Vomiting of pregnancy, unspecified: Secondary | ICD-10-CM

## 2023-04-17 DIAGNOSIS — K8050 Calculus of bile duct without cholangitis or cholecystitis without obstruction: Secondary | ICD-10-CM

## 2023-04-17 DIAGNOSIS — Z98891 History of uterine scar from previous surgery: Secondary | ICD-10-CM

## 2023-04-17 DIAGNOSIS — O34219 Maternal care for unspecified type scar from previous cesarean delivery: Secondary | ICD-10-CM

## 2023-04-17 DIAGNOSIS — O0990 Supervision of high risk pregnancy, unspecified, unspecified trimester: Secondary | ICD-10-CM

## 2023-04-17 DIAGNOSIS — Z3A17 17 weeks gestation of pregnancy: Secondary | ICD-10-CM

## 2023-04-17 DIAGNOSIS — O24419 Gestational diabetes mellitus in pregnancy, unspecified control: Secondary | ICD-10-CM

## 2023-04-17 LAB — GLUCOSE, CAPILLARY: Glucose-Capillary: 78 mg/dL (ref 70–99)

## 2023-04-17 MED ORDER — ONDANSETRON HCL 4 MG PO TABS: 4.0000 mg | ORAL_TABLET | Freq: Three times a day (TID) | ORAL | 3 refills | Status: DC | PRN

## 2023-04-17 MED ORDER — FAMOTIDINE 20 MG PO TABS
20.0000 mg | ORAL_TABLET | Freq: Two times a day (BID) | ORAL | 3 refills | Status: DC
Start: 2023-04-17 — End: 2024-05-06

## 2023-04-17 MED ORDER — TRAMADOL HCL 50 MG PO TABS
50.0000 mg | ORAL_TABLET | Freq: Four times a day (QID) | ORAL | 0 refills | Status: DC | PRN
Start: 2023-04-17 — End: 2023-07-10

## 2023-04-17 NOTE — Progress Notes (Signed)
PRENATAL VISIT NOTE  Subjective:  Jacqueline Watkins is a 37 y.o. [phone] at [redacted]w[redacted]d being seen today for ongoing prenatal care.  She is currently monitored for the following issues for this high-risk pregnancy and has Constipation; Adenomatous polyp of ascending colon; Abnormal colonoscopy; Abnormal CT scan, colon; Supervision of high risk pregnancy, antepartum; Delivery with history of C-section; History of VBAC x2; and GDM (gestational diabetes mellitus) on their problem list.  Patient reports  sharp, worsening RUQ pain, nausea, not being able to eat. Suspicion of gall bladder etiology but [place] Nml in July.  . Denies fever, chills, vomiting. Has been trying recommended dietary changes without improvement. Contractions: Not present. Vag. Bleeding: None.  Movement: Absent. Denies leaking of fluid.   The following portions of the patient's history were reviewed and updated as appropriate: allergies, current medications, past family history, past medical history, past social history, past surgical history and problem list.   Objective:   Vitals:   04/17/23 1007  BP: 115/79  Pulse: (!) 101  Weight: 165 lb 4.8 oz (75 kg)    Fetal Status: Fetal Heart Rate (bpm): 145   Movement: Absent    Fundal Height U/2.  General:  Alert, oriented and cooperative. Patient is in mild distress.   Skin: Skin is warm and dry. No rash noted.   Cardiovascular: Normal heart rate noted  Respiratory: Normal respiratory effort, no problems with respiration noted  Abdomen: Soft, gravid, appropriate for gestational age.  Pain/Pressure: Present     Pelvic: Cervical exam deferred        Extremities: Normal range of motion.  Edema: None  Mental Status: Normal mood and affect. Normal behavior. Normal judgment and thought content.   Assessment and Plan:  Pregnancy: [id number] at [redacted]w[redacted]d 1. Supervision of high risk pregnancy, antepartum - Anatomy US scheduled  2. Gestational diabetes mellitus (GDM) in second trimester,  gestational diabetes method of control unspecified - DNKA Dm education. Rescheduled to tomorrow. Strongly encouraged to keep and appt and check CBGs consistently.   3. History of VBAC x2 - Plans TOLAC. MD visit for consent.   4. Delivery with history of C-section - Plans TOLAC  5. Recurrent biliary colic  - ondansetron (ZOFRAN) 4 MG tablet; Take 1-2 tablets (4-8 mg total) by mouth every 8 (eight) hours as needed for nausea or vomiting.  Dispense: 30 tablet; Refill: 3 - traMADol (ULTRAM) 50 MG tablet; Take 1-2 tablets (50-100 mg total) by mouth every 6 (six) hours as needed for severe pain.  Dispense: 30 tablet; Refill: 0 - famotidine (PEPCID) 20 MG tablet; Take 1 tablet (20 mg total) by mouth 2 (two) times daily.  Dispense: 60 tablet; Refill: 3 - US ABDOMEN LIMITED RUQ (LIVER/GB); Future  6. Nausea and vomiting during pregnancy  - ondansetron (ZOFRAN) 4 MG tablet; Take 1-2 tablets (4-8 mg total) by mouth every 8 (eight) hours as needed for nausea or vomiting.  Dispense: 30 tablet; Refill: 3 - famotidine (PEPCID) 20 MG tablet; Take 1 tablet (20 mg total) by mouth 2 (two) times daily.  Dispense: 60 tablet; Refill: 3  7. [redacted] weeks gestation of pregnancy - Ask about AFP NV. Pt very focused of RUQ pain today.  Encouraged to complete financial assistance application  Spanish interpreter used.   Preterm labor symptoms and general obstetric precautions including but not limited to vaginal bleeding, contractions, leaking of fluid and fetal movement were reviewed in detail with the patient. Please refer to After Visit Summary for other counseling recommendations.  Return in about 4 weeks (around 05/15/2023).  Future Appointments  Date Time Provider Department Center  04/18/2023  3:15 PM [facility] [address] [facility]  04/25/2023  9:15 AM [facility] NURSE [facility]  04/25/2023  9:30 AM [facility] US2 [facility]    Dorathy Kinsman, CNM

## 2023-04-18 ENCOUNTER — Other Ambulatory Visit: Payer: Self-pay

## 2023-04-18 DIAGNOSIS — O09529 Supervision of elderly multigravida, unspecified trimester: Secondary | ICD-10-CM | POA: Insufficient documentation

## 2023-04-18 DIAGNOSIS — O99210 Obesity complicating pregnancy, unspecified trimester: Secondary | ICD-10-CM | POA: Insufficient documentation

## 2023-04-25 ENCOUNTER — Other Ambulatory Visit: Payer: Self-pay | Admitting: *Deleted

## 2023-04-25 ENCOUNTER — Ambulatory Visit: Payer: Self-pay | Attending: Obstetrics & Gynecology

## 2023-04-25 ENCOUNTER — Ambulatory Visit: Payer: Self-pay | Admitting: *Deleted

## 2023-04-25 ENCOUNTER — Encounter: Payer: Self-pay | Admitting: *Deleted

## 2023-04-25 VITALS — BP 122/73 | HR 99

## 2023-04-25 DIAGNOSIS — O24419 Gestational diabetes mellitus in pregnancy, unspecified control: Secondary | ICD-10-CM | POA: Insufficient documentation

## 2023-04-25 DIAGNOSIS — O0990 Supervision of high risk pregnancy, unspecified, unspecified trimester: Secondary | ICD-10-CM

## 2023-04-25 DIAGNOSIS — O09522 Supervision of elderly multigravida, second trimester: Secondary | ICD-10-CM

## 2023-04-25 DIAGNOSIS — O99210 Obesity complicating pregnancy, unspecified trimester: Secondary | ICD-10-CM | POA: Insufficient documentation

## 2023-04-25 DIAGNOSIS — Z36 Encounter for antenatal screening for chromosomal anomalies: Secondary | ICD-10-CM | POA: Insufficient documentation

## 2023-05-07 ENCOUNTER — Other Ambulatory Visit: Payer: Self-pay

## 2023-05-15 ENCOUNTER — Encounter: Payer: Self-pay | Admitting: Obstetrics & Gynecology

## 2023-05-15 ENCOUNTER — Other Ambulatory Visit: Payer: Self-pay

## 2023-05-15 ENCOUNTER — Ambulatory Visit (INDEPENDENT_AMBULATORY_CARE_PROVIDER_SITE_OTHER): Payer: Self-pay | Admitting: Obstetrics & Gynecology

## 2023-05-15 VITALS — BP 107/70 | HR 108 | Wt 168.0 lb

## 2023-05-15 DIAGNOSIS — O24419 Gestational diabetes mellitus in pregnancy, unspecified control: Secondary | ICD-10-CM

## 2023-05-15 DIAGNOSIS — Z98891 History of uterine scar from previous surgery: Secondary | ICD-10-CM

## 2023-05-15 DIAGNOSIS — O26892 Other specified pregnancy related conditions, second trimester: Secondary | ICD-10-CM

## 2023-05-15 DIAGNOSIS — R519 Headache, unspecified: Secondary | ICD-10-CM

## 2023-05-15 DIAGNOSIS — Z3A21 21 weeks gestation of pregnancy: Secondary | ICD-10-CM

## 2023-05-15 DIAGNOSIS — O0990 Supervision of high risk pregnancy, unspecified, unspecified trimester: Secondary | ICD-10-CM

## 2023-05-15 NOTE — Progress Notes (Signed)
PRENATAL VISIT NOTE  Subjective:  Jacqueline Watkins is a 37 y.o. [phone] at [redacted]w[redacted]d being seen today for ongoing prenatal care. Patient is Spanish-speaking only, interpreter present for this encounter. She is currently monitored for the following issues for this high-risk pregnancy and has Adenomatous polyp of ascending colon; Abnormal colonoscopy; Abnormal CT scan, colon; Supervision of high risk pregnancy, antepartum; History of C-section; History of VBAC x2; GDM (gestational diabetes mellitus); AMA (advanced maternal age) multigravida 35+; and Obesity affecting pregnancy, antepartum on their problem list.  Patient reports  headache not alleviated by Tylenol .  Contractions: Not present. Vag. Bleeding: None.  Movement: Present. Denies leaking of fluid.   The following portions of the patient's history were reviewed and updated as appropriate: allergies, current medications, past family history, past medical history, past social history, past surgical history and problem list.   Objective:   Vitals:   05/15/23 1433  BP: 107/70  Pulse: (!) 108  Weight: 168 lb (76.2 kg)    Fetal Status: Fetal Heart Rate (bpm): 145   Movement: Present     General:  Alert, oriented and cooperative. Patient is in no acute distress.  Skin: Skin is warm and dry. No rash noted.   Cardiovascular: Normal heart rate noted  Respiratory: Normal respiratory effort, no problems with respiration noted  Abdomen: Soft, gravid, appropriate for gestational age.  Pain/Pressure: Absent     Pelvic: Cervical exam deferred        Extremities: Normal range of motion.  Edema: None  Mental Status: Normal mood and affect. Normal behavior. Normal judgment and thought content.   [place] MFM OB DETAIL +14 WK  Result Date: 04/25/2023 ----------------------------------------------------------------------  OBSTETRICS REPORT                       (Signed Final 04/25/2023 12:04 pm)  ---------------------------------------------------------------------- Patient Info  ID #:       [id number]                          D.O.B.:  [birth year] (37 yrs)  Name:       Jacqueline WIMBUSH-               Visit Date: 04/25/2023 09:40 am              JOYA ---------------------------------------------------------------------- Performed By  Attending:        Noralee Space MD        Ref. Address:     [address]                                                             [place], [place]                                                             [address]  Performed By:     Eden Lathe BS      [facility]  Referred By:      [facility] ---------------------------------------------------------------------- Orders  #  Description                           Code        Ordered By  1  [place] MFM OB DETAIL +14 WK               76811.01    Jaynie Collins ----------------------------------------------------------------------  #  Order #                     Accession #                Episode #  1  [id number]                   [id number]                 [id number] ---------------------------------------------------------------------- Indications  Advanced maternal age multigravida 25+,        O71.522  second trimester  Gestational diabetes in pregnancy,             O24.419  unspecified control  Obesity complicating pregnancy, second         O99.212  trimester  Poor obstetric history: Previous preterm       O09.219  delivery, antepartum (32 wk PPROM)  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 Cord Insertion:      Visualized  Amniotic Fluid  AFI FV:      Within normal limits                              Largest Pocket(cm)  6.01 ---------------------------------------------------------------------- Biometry  BPD:      43.1  mm     G. Age:  19w 0d         52  %    CI:        70.46   %    70 - 86                                                          FL/HC:      17.0   %    16.1 - 18.3  HC:      163.7  mm     G. Age:  19w 1d         48  %    HC/AC:      1.12        1.09 - 1.39  AC:      145.7  mm     G. Age:  19w 6d         75  %    FL/BPD:     64.5   %  FL:       27.8  mm     G. Age:  18w 4d         26  %    FL/AC:      19.1   %    20 - 24  HUM:      27.7  mm     G. Age:  18w 6d         47  %  CER:      19.3  mm     G. Age:  18w 6d         26  %  NFT:       2.7  mm  LV:        6.5  mm  CM:        3.7  mm  Est. FW:     280  gm    0 lb 10 oz      58  % ---------------------------------------------------------------------- OB History  Gravidity:    5         Term:   2        Prem:   1        SAB:   1  TOP:          0       Ectopic:  0        Living: 3 ---------------------------------------------------------------------- Gestational Age  LMP:           19w 0d        Date:  12/13/22                  EDD:   09/19/23  U/S Today:     19w 1d                                        EDD:   09/18/23  Best:          19w 0d     Det. By:  LMP  (12/13/22)          EDD:   09/19/23 ---------------------------------------------------------------------- Targeted  Anatomy  Central Nervous System  Calvarium/Cranial V.:  Appears normal         Cereb./Vermis:          Appears normal  Cavum:                 Appears normal         Cisterna Magna:          Appears normal  Lateral Ventricles:    Appears normal         Midline Falx:           Appears normal  Choroid Plexus:        Appears normal  Spine  Cervical:              Appears normal         Sacral:                 Appears Normal  Thoracic:              Appears normal         Shape/Curvature:        Appears normal  Lumbar:                Appears normal  Head/Neck  Lips:                  Not well visualized    Profile:                Not well visualized  Neck:                  Appears normal         Orbits/Eyes:            Appears normal  Nuchal Fold:           Appears normal         Mandible:               Not well visualized  Nasal Bone:            Not well visualized    Maxilla:                Not well visualized  Palate:                Not well visualized  Thorax  Lungs:                 Appears normal         SVC:                    Not well visualized  4 Chamber View:        Appears normal         Interventr. Septum:     Appears normal  Cardiac Activity:      Observed               Cardiac Axis:           Normal  Cardiac Rhythm:        Normal                 Diaphragm:              Appears normal  Cardiac Situs:         Appears normal         3 Vessel View:          Appears normal  Rt Outflow Tract:      Not well visualized    3 V Trachea View:       Appears normal  Lt Outflow Tract:      Appears normal         IVC:                    Not well visualized  Aortic Arch:           Appears normal         Crossing:               Not well visualized  Ductal Arch:           Not well visualized  Abdomen  Ventral Wall:          Appears normal         Lt Kidney:              Appears normal  Cord Insertion:        Appears normal         Rt Kidney:              Appears normal  Situs:                 Appears normal         Bladder:                Appears normal  Stomach:               Appears normal  Extremities  Lt Humerus:            Appears normal         Lt Femur:               Appears normal  Rt Humerus:             Appears normal         Rt Femur:               Appears normal  Lt Forearm:            Appears normal         Lt Lower Leg:           Appears normal  Rt Forearm:            Appears normal         Rt Lower Leg:           Appears normal  Lt Hand:               Open hand nml          Lt Foot:                Foot visualized  Rt Hand:               Open hand nml          Rt Foot:                Foot visualized  Other  Umbilical Cord:        Normal 3-vessel        Genitalia:              Female-nml ---------------------------------------------------------------------- Cervix Uterus Adnexa  Cervix  Length:            3.7  cm.  Normal appearance by transabdominal scan  Uterus  No abnormality visualized.  Right  Ovary  Not visualized.  Left Ovary  Not visualized.  Cul De Sac  No free fluid seen.  Adnexa  No abnormality visualized ---------------------------------------------------------------------- Impression  G5 W2956. Patient is here for fetal anatomy scan.  Advanced maternal age.  On cell-free fetal DNA screening,  the risks of aneuploidies are not increased.  Obstructive history significant for a cesarean delivery  followed by 2 VBACs.  We performed fetal anatomy scan. No makers of  aneuploidies or fetal structural defects are seen.  Fetal  anatomical survey could not be completed because of fetal  position.  Fetal biometry is consistent with her previously-  established dates. Amniotic fluid is normal and good fetal  activity is seen. Patient understands the limitations of  ultrasound in detecting fetal anomalies.  As maternal obesity imposes limitations on the resolution of  images, fetal anomalies may be missed.  Patient has an early diagnosis of gestational diabetes.  She  has not yet started checking her blood glucose levels but has  a glucometer with her.  I discussed ultrasound findings with the patient with the help  of Spanish language interpreter ([employer] services).  I  encouraged her to check her blood glucose  regularly. ---------------------------------------------------------------------- Recommendations  -An appointment was made for her to return in 4 weeks for  completion of fetal anatomy. ----------------------------------------------------------------------                 Noralee Space, MD Electronically Signed Final Report   04/25/2023 12:04 pm ----------------------------------------------------------------------    Assessment and Plan:  Pregnancy: [id number] at [redacted]w[redacted]d 1. Pregnancy headache in second trimester Recommended Excedrin Tension headache, told to call/go to [facility]U for worsening headaches or uncontrolled headaches.  Consider Imitrex +/- imaging if headaches still persistent.   2. Gestational diabetes mellitus (GDM) in second trimester, gestational diabetes method of control unspecified Missed GDM education meeting, this will be rescheduled. Checking fingersticks, had one abnormal value after lunch in the last 4 days. Not checking after dinner as she has fruit only, told to still check as some fruit can cause BS elevation.  Given early diagnosis of GDM, fetal ECHO may be considered but this is cost-prohibitive for her.   3. History of VBAC x2 Counseled regarding TOLAC vs RCS; risks/benefits discussed in detail. All questions answered.  Patient elects for TOLAC, consent signed 05/15/2023.  4. [redacted] weeks gestation of pregnancy 5. Supervision of high risk pregnancy, antepartum No other concerns.  Preterm labor symptoms and general obstetric precautions including but not limited to vaginal bleeding, contractions, leaking of fluid and fetal movement were reviewed in detail with the patient. Please refer to After Visit Summary for other counseling recommendations.   Return in about 4 weeks (around 06/12/2023) for OFFICE OB VISIT (MD only).  Future Appointments  Date Time Provider Department Center  05/23/2023  9:15 AM [facility] NURSE [facility]  05/23/2023  9:30 AM [facility] US3 [facility]  06/04/2023   8:15 AM [facility]  06/11/2023  1:15 PM Crissie Reese, Mary Sella, MD [facility]    Jaynie Collins, MD

## 2023-05-23 ENCOUNTER — Other Ambulatory Visit: Payer: Self-pay | Admitting: *Deleted

## 2023-05-23 ENCOUNTER — Ambulatory Visit: Payer: Self-pay | Attending: Obstetrics and Gynecology

## 2023-05-23 ENCOUNTER — Ambulatory Visit: Payer: Self-pay | Admitting: *Deleted

## 2023-05-23 VITALS — BP 116/74 | HR 93

## 2023-05-23 DIAGNOSIS — O24419 Gestational diabetes mellitus in pregnancy, unspecified control: Secondary | ICD-10-CM | POA: Insufficient documentation

## 2023-05-23 DIAGNOSIS — O0990 Supervision of high risk pregnancy, unspecified, unspecified trimester: Secondary | ICD-10-CM | POA: Insufficient documentation

## 2023-05-23 DIAGNOSIS — O34219 Maternal care for unspecified type scar from previous cesarean delivery: Secondary | ICD-10-CM

## 2023-05-23 DIAGNOSIS — O09212 Supervision of pregnancy with history of pre-term labor, second trimester: Secondary | ICD-10-CM

## 2023-05-23 DIAGNOSIS — E669 Obesity, unspecified: Secondary | ICD-10-CM

## 2023-05-23 DIAGNOSIS — O24410 Gestational diabetes mellitus in pregnancy, diet controlled: Secondary | ICD-10-CM

## 2023-05-23 DIAGNOSIS — O99212 Obesity complicating pregnancy, second trimester: Secondary | ICD-10-CM

## 2023-05-23 DIAGNOSIS — O99282 Endocrine, nutritional and metabolic diseases complicating pregnancy, second trimester: Secondary | ICD-10-CM

## 2023-05-23 DIAGNOSIS — O09522 Supervision of elderly multigravida, second trimester: Secondary | ICD-10-CM

## 2023-05-23 DIAGNOSIS — Z3A23 23 weeks gestation of pregnancy: Secondary | ICD-10-CM

## 2023-06-04 ENCOUNTER — Ambulatory Visit (INDEPENDENT_AMBULATORY_CARE_PROVIDER_SITE_OTHER): Payer: Self-pay | Admitting: Dietician

## 2023-06-04 ENCOUNTER — Encounter: Payer: Self-pay | Attending: Obstetrics & Gynecology | Admitting: Dietician

## 2023-06-04 ENCOUNTER — Other Ambulatory Visit: Payer: Self-pay

## 2023-06-04 DIAGNOSIS — O24419 Gestational diabetes mellitus in pregnancy, unspecified control: Secondary | ICD-10-CM | POA: Insufficient documentation

## 2023-06-04 DIAGNOSIS — Z713 Dietary counseling and surveillance: Secondary | ICD-10-CM | POA: Insufficient documentation

## 2023-06-04 DIAGNOSIS — O99212 Obesity complicating pregnancy, second trimester: Secondary | ICD-10-CM | POA: Insufficient documentation

## 2023-06-04 DIAGNOSIS — Z3A24 24 weeks gestation of pregnancy: Secondary | ICD-10-CM | POA: Insufficient documentation

## 2023-06-04 NOTE — Progress Notes (Signed)
Patient was seen for Gestational Diabetes self-management on 06/04/2023.  Start time 0920 and End time 1020  Patient is here today alone.   She complains of increased headaches, backache and lower abdominal discomfort from the weight of the abdomen.  Estimated due date: 09/18/2022; [redacted]w[redacted]d  [employer] [phone]  Clinical: Medications: Prenatal vitamin,  Medical History: GDM, anemia Labs:  A1c - unknown  Dietary and Lifestyle History: Patient lives with her 3 children.  She is not employed.  Physical Activity: ADL's Stress: 7/10 Sleep: Sleeps little due to pain  24 hr Recall:  First Meal: coffee with milk and honey, eggs, corn tortilla, avocado Snack:  occasional Second meal:  soup with noodles and vegetables Snack:  fruit, plain yogurt Third meal: none as she gets pain at her gallbladder area  Snack:  none Beverages:  water, coffee with milk and honey    NUTRITION INTERVENTION  Nutrition education (E-1) on the following topics:   Initial Follow-up  [x]  []  Definition of Gestational Diabetes [x]  []  Why dietary management is important in controlling blood glucose [x]  []  Effects each nutrient has on blood glucose levels [x]  []  Simple carbohydrates vs complex carbohydrates [x]  []  Fluid intake [x]  []  Creating a balanced meal plan []  []  Carbohydrate counting  [x]  []  When to check blood glucose levels [x]  []  Proper blood glucose monitoring techniques []  []  Effect of stress and stress reduction techniques  [x]  []  Exercise effect on blood glucose levels, appropriate exercise during pregnancy [x]  []  Importance of limiting caffeine and abstaining from alcohol and smoking []  []  Medications used for blood sugar control during pregnancy [x]  []  Hypoglycemia and rule of 15 [x]  []  Postpartum self care  Blood glucose monitor given: [employer] Lot # [id number] Strips - [id number] C-2 Exp: 09/03/2024 CBG: 81 mg/dL  Patient has a meter prior to visit but she said the  strips didn't match her blood glucose meter.  Provided a meter to assure she had something to test.  Patient instructed to monitor glucose levels: FBS: 60 - <= 95 mg/dL; 2 hour: <= 119 mg/dL  Patient received handouts: (Spanish) Nutrition Diabetes and Pregnancy Carbohydrate Counting List Blood glucose log Snack ideas for diabetes during pregnancy  Patient will be seen for follow-up as needed.

## 2023-06-11 ENCOUNTER — Ambulatory Visit (INDEPENDENT_AMBULATORY_CARE_PROVIDER_SITE_OTHER): Payer: Self-pay | Admitting: Family Medicine

## 2023-06-11 ENCOUNTER — Encounter: Payer: Self-pay | Admitting: Family Medicine

## 2023-06-11 ENCOUNTER — Other Ambulatory Visit: Payer: Self-pay

## 2023-06-11 VITALS — BP 113/71 | HR 98 | Wt 164.0 lb

## 2023-06-11 DIAGNOSIS — O0990 Supervision of high risk pregnancy, unspecified, unspecified trimester: Secondary | ICD-10-CM

## 2023-06-11 DIAGNOSIS — O09522 Supervision of elderly multigravida, second trimester: Secondary | ICD-10-CM

## 2023-06-11 DIAGNOSIS — D122 Benign neoplasm of ascending colon: Secondary | ICD-10-CM

## 2023-06-11 DIAGNOSIS — Z98891 History of uterine scar from previous surgery: Secondary | ICD-10-CM

## 2023-06-11 DIAGNOSIS — A048 Other specified bacterial intestinal infections: Secondary | ICD-10-CM | POA: Insufficient documentation

## 2023-06-11 DIAGNOSIS — O24410 Gestational diabetes mellitus in pregnancy, diet controlled: Secondary | ICD-10-CM

## 2023-06-11 NOTE — Progress Notes (Signed)
   Subjective:  Jacqueline Watkins is a 37 y.o. [phone] at [redacted]w[redacted]d being seen today for ongoing prenatal care.  She is currently monitored for the following issues for this high-risk pregnancy and has Adenomatous polyp of ascending colon; Abnormal colonoscopy; Abnormal CT scan, colon; Supervision of high risk pregnancy, antepartum; History of C-section; History of VBAC x2; GDM (gestational diabetes mellitus); AMA (advanced maternal age) multigravida 35+; Obesity affecting pregnancy, antepartum; and H. pylori infection on their problem list.  Patient reports no complaints.  Contractions: Not present. Vag. Bleeding: None.  Movement: Present. Denies leaking of fluid.   The following portions of the patient's history were reviewed and updated as appropriate: allergies, current medications, past family history, past medical history, past social history, past surgical history and problem list. Problem list updated.  Objective:   Vitals:   06/11/23 1322  BP: 113/71  Pulse: 98  Weight: 164 lb (74.4 kg)    Fetal Status: Fetal Heart Rate (bpm): 135   Movement: Present     General:  Alert, oriented and cooperative. Patient is in no acute distress.  Skin: Skin is warm and dry. No rash noted.   Cardiovascular: Normal heart rate noted  Respiratory: Normal respiratory effort, no problems with respiration noted  Abdomen: Soft, gravid, appropriate for gestational age. Pain/Pressure: Absent     Pelvic: Vag. Bleeding: None     Cervical exam deferred        Extremities: Normal range of motion.  Edema: None  Mental Status: Normal mood and affect. Normal behavior. Normal judgment and thought content.   Urinalysis:        Assessment and Plan:  Pregnancy: [id number] at [redacted]w[redacted]d  1. Supervision of high risk pregnancy, antepartum BP and FHR normal Third trimester labs next visit  2. Diet controlled gestational diabetes mellitus (GDM) in second trimester See log above, most c/w dietary indiscretion Discussed  she will need to focus on diet more, but if still uncontrolled like today will need to start metformin next visit Last growth [place] 05/23/23, EFW 36%, AC 59%, AFI wnl, follow up scheduled for 06/27/23  3. History of VBAC x2 Elects for TOLAC Consent signed 05/15/23  4. Multigravida of advanced maternal age in second trimester Following w MFM On ASA  5. H. pylori infection Diagnosed at time of upper/lower endoscopy in 2022 Reports she did take two weeks of antibiotics but never got test of cure Plan to refer to GI postpartum  6. Adenomatous polyp of ascending colon Per review of chart had colonoscopy for polyp removal in 2022, recommendation for surveillance colonoscopy in 6 months but has not been seen since Discussed importance of follow up postpartum with GI  Preterm labor symptoms and general obstetric precautions including but not limited to vaginal bleeding, contractions, leaking of fluid and fetal movement were reviewed in detail with the patient. Please refer to After Visit Summary for other counseling recommendations.  Return in 2 weeks (on 06/25/2023) for [facility], ob visit.   Venora Maples, MD

## 2023-06-11 NOTE — Patient Instructions (Signed)
Opciones de mtodos anticonceptivos Birth Control Options Los mtodos anticonceptivos tambin se denominan anticonceptivos. Los anticonceptivos previenen Firefighter. Hay muchos tipos de anticonceptivos. Trabaje con el mdico para encontrar la opcin ms adecuada para usted. Anticonceptivos que [place] hormonas Estos tipos de anticonceptivos contienen hormonas para Neurosurgeon. Implante anticonceptivo Este es un pequeo tubo que se coloca dentro de la piel del brazo. El tubo Insurance claims handler colocado durante 3 aos como mximo. Inyeccin anticonceptiva Son inyecciones que se aplican cada 3 meses. Pldoras anticonceptivas Esta es una pldora que se toma todos [place]. Debe tomarla a la [employer]. Parche anticonceptivo Este es un parche que se coloca sobre la piel. Se debe cambiar 1 vez por semana durante 3 semanas. Despus de [employer], el parche se debe retirar durante 1semana. Anillo vaginal  Este es un anillo de plstico blando que se coloca dentro de la vagina. El anillo se deja colocado durante 3 semanas. Luego, se debe retirar durante 1 semana. Despus se coloca un nuevo anillo. Mtodos de barrera  Preservativo masculino Este es una cubierta delgada que se coloca sobre el pene antes de tener sexo. El preservativo se desecha despus de Doctor, hospital. Preservativo femenino Este es una cubierta blanda y suelta que se coloca en la vagina antes de [place]. El preservativo se desecha despus de Doctor, hospital. Diafragma El diafragma es una barrera blanda con forma de tazn. Debe estar hecho para adaptarse a su cuerpo. Se coloca en la vagina antes de tener sexo con una sustancia qumica que destruye los espermatozoides llamada espermicida. El Designer, fashion/clothing en la vagina durante 6 a 8 horas despus de tener sexo y debe retirarse en un plazo de 24 horas. El diafragma se debe reemplazar: Cada 1 o 2 aos. Despus de dar a luz. Despus de aumentar ms de 15  libras (6.8kg). Si se somete a una ciruga en la pelvis. Capuchn cervical Este es un capuchn pequeo y [place] se fija sobre el cuello uterino. El cuello uterino es la parte ms baja del tero. Se coloca en la vagina antes del sexo, junto con un espermicida. El capuchn debe fabricarse para usted. El capuchn se debe dejar colocado durante 6 a 8horas despus del sexo. Se debe retirar en un plazo de 48horas. El capuchn cervical debe ser recetado y adaptado a su cuerpo por un mdico. Debe reemplazarse cada 2aos. Esponja Esta es una esponja pequea que se coloca en la vagina antes de [place]. Se debe dejar colocada durante al menos 6horas despus de [employer]. Se debe retirar en un plazo de 30horas y desecharse. Espermicidas Estos son sustancias qumicas que destruyen o impiden que los espermatozoides ingresen al tero. Se pueden presentar en forma de pldora, crema, gel o espuma que se debe colocar en la vagina. Se deben usar al [employer] de 10 a antes de [employer]. Dispositivo intrauterino Un dispositivo intrauterino (DIU) es un dispositivo que un mdico coloca dentro del tero. Existen dos tipos: DIU hormonal. Este tipo puede permanecer colocado durante 3 a 5aos. DIU de cobre. Este tipo Insurance claims handler colocado durante 10aos. Mtodos anticonceptivos permanentes Ligadura de trompas en la mujer Esto es una ciruga para obstruir las trompas de [place]. Esterilizacin histeroscpica Este es un procedimiento para Scientific laboratory technician un dispositivo en cada trompa de [place]. Este mtodo funciona al cabo de 3 meses. Se deben usar otros mtodos anticonceptivos durante 3 meses. Esterilizacin masculina Esta es una [place], llamada vasectoma, para ligar los conductos  que transportan los espermatozoides en los hombres. Este mtodo funciona al cabo de 3 meses. Se deben usar otros mtodos anticonceptivos durante 3 meses. Mtodos de planificacin natural Esto significa no tener [employer] la pareja femenina podra quedar embarazada. A continuacin se mencionan algunos mtodos anticonceptivos por planificacin natural: Usar un calendario a fin de: Hacer un seguimiento de la duracin de cada ciclo menstrual. Determinar en [employer] se podra producir Firefighter. Planificar no tener [employer] en que se podra producir Firefighter. Reconocer los signos de la ovulacin y no tener relaciones sexuales durante ese perodo. La pareja femenina puede detectar cundo ser la ovulacin haciendo un seguimiento de su temperatura todos [place]. Tambin puede examinar si hay cambios en la mucosidad que proviene del cuello uterino. Dnde obtener ms informacin Centers for Disease Control and Prevention (Centros para el Control y la Prevencin de [place]): TonerPromos.no Esta informacin no tiene Theme park manager el consejo del mdico. Asegrese de hacerle al mdico cualquier pregunta que tenga. Document Revised: 11/22/2022 Document Reviewed: 11/22/2022 Elsevier Patient Education  2024 [employer].

## 2023-06-27 ENCOUNTER — Encounter: Payer: Self-pay | Admitting: Family Medicine

## 2023-06-27 ENCOUNTER — Ambulatory Visit: Payer: Self-pay | Attending: Maternal & Fetal Medicine

## 2023-06-27 ENCOUNTER — Other Ambulatory Visit: Payer: Self-pay

## 2023-06-27 ENCOUNTER — Other Ambulatory Visit: Payer: Self-pay | Admitting: *Deleted

## 2023-06-27 ENCOUNTER — Ambulatory Visit (INDEPENDENT_AMBULATORY_CARE_PROVIDER_SITE_OTHER): Payer: Self-pay | Admitting: Family Medicine

## 2023-06-27 VITALS — BP 111/73 | HR 94 | Wt 163.0 lb

## 2023-06-27 DIAGNOSIS — O09523 Supervision of elderly multigravida, third trimester: Secondary | ICD-10-CM

## 2023-06-27 DIAGNOSIS — O99212 Obesity complicating pregnancy, second trimester: Secondary | ICD-10-CM | POA: Insufficient documentation

## 2023-06-27 DIAGNOSIS — O99213 Obesity complicating pregnancy, third trimester: Secondary | ICD-10-CM

## 2023-06-27 DIAGNOSIS — D122 Benign neoplasm of ascending colon: Secondary | ICD-10-CM

## 2023-06-27 DIAGNOSIS — E669 Obesity, unspecified: Secondary | ICD-10-CM

## 2023-06-27 DIAGNOSIS — Z3A28 28 weeks gestation of pregnancy: Secondary | ICD-10-CM

## 2023-06-27 DIAGNOSIS — O24410 Gestational diabetes mellitus in pregnancy, diet controlled: Secondary | ICD-10-CM

## 2023-06-27 DIAGNOSIS — O0990 Supervision of high risk pregnancy, unspecified, unspecified trimester: Secondary | ICD-10-CM

## 2023-06-27 DIAGNOSIS — O34219 Maternal care for unspecified type scar from previous cesarean delivery: Secondary | ICD-10-CM

## 2023-06-27 DIAGNOSIS — A048 Other specified bacterial intestinal infections: Secondary | ICD-10-CM

## 2023-06-27 DIAGNOSIS — O0993 Supervision of high risk pregnancy, unspecified, third trimester: Secondary | ICD-10-CM

## 2023-06-27 DIAGNOSIS — Z98891 History of uterine scar from previous surgery: Secondary | ICD-10-CM

## 2023-06-27 DIAGNOSIS — O24419 Gestational diabetes mellitus in pregnancy, unspecified control: Secondary | ICD-10-CM | POA: Insufficient documentation

## 2023-06-27 NOTE — Progress Notes (Signed)
   Subjective:  Jacqueline Watkins is a 37 y.o. [phone] at [redacted]w[redacted]d being seen today for ongoing prenatal care.  She is currently monitored for the following issues for this high-risk pregnancy and has Adenomatous polyp of ascending colon; Abnormal colonoscopy; Abnormal CT scan, colon; Supervision of high risk pregnancy, antepartum; History of C-section; History of VBAC x2; GDM (gestational diabetes mellitus); AMA (advanced maternal age) multigravida 35+; Obesity affecting pregnancy, antepartum; and H. pylori infection on their problem list.  Patient reports no complaints.  Contractions: Not present. Vag. Bleeding: None.  Movement: Present. Denies leaking of fluid.   The following portions of the patient's history were reviewed and updated as appropriate: allergies, current medications, past family history, past medical history, past social history, past surgical history and problem list. Problem list updated.  Objective:   Vitals:   06/27/23 0817  BP: 111/73  Pulse: 94  Weight: 163 lb (73.9 kg)    Fetal Status: Fetal Heart Rate (bpm): 138   Movement: Present     General:  Alert, oriented and cooperative. Patient is in no acute distress.  Skin: Skin is warm and dry. No rash noted.   Cardiovascular: Normal heart rate noted  Respiratory: Normal respiratory effort, no problems with respiration noted  Abdomen: Soft, gravid, appropriate for gestational age. Pain/Pressure: Absent     Pelvic: Vag. Bleeding: None     Cervical exam deferred        Extremities: Normal range of motion.  Edema: None  Mental Status: Normal mood and affect. Normal behavior. Normal judgment and thought content.   Urinalysis:        Assessment and Plan:  Pregnancy: [id number] at [redacted]w[redacted]d  1. Supervision of high risk pregnancy, antepartum BP and FHR normal Third trimester labs today Given letter for TDAP and flu at [facility] Husband present with her. Counseled extensively on vasectomy, they will consider. She is also  considering nexplanon  2. Diet controlled gestational diabetes mellitus (GDM) in third trimester 79% at goal, see log above Overall doing well with diet control Has follow up growth [place] later today  3. History of VBAC x2 Elects for TOLAC Consent signed 05/15/23   4. Multigravida of advanced maternal age in second trimester Following w MFM On ASA   5. H. pylori infection Diagnosed at time of upper/lower endoscopy in 2022 Reports she did take two weeks of antibiotics but never got test of cure Plan to refer to GI postpartum   6. Adenomatous polyp of ascending colon Per review of chart had colonoscopy for polyp removal in 2022, recommendation for surveillance colonoscopy in 6 months but has not been seen since Discussed importance of follow up postpartum with GI  Preterm labor symptoms and general obstetric precautions including but not limited to vaginal bleeding, contractions, leaking of fluid and fetal movement were reviewed in detail with the patient. Please refer to After Visit Summary for other counseling recommendations.  Return in 2 weeks (on 07/11/2023) for [facility], ob visit.   Venora Maples, MD

## 2023-06-27 NOTE — Patient Instructions (Signed)
Opciones de mtodos anticonceptivos Birth Control Options Los mtodos anticonceptivos tambin se denominan anticonceptivos. Los anticonceptivos previenen Firefighter. Hay muchos tipos de anticonceptivos. Trabaje con el mdico para encontrar la opcin ms adecuada para usted. Anticonceptivos que [place] hormonas Estos tipos de anticonceptivos contienen hormonas para Neurosurgeon. Implante anticonceptivo Este es un pequeo tubo que se coloca dentro de la piel del brazo. El tubo Insurance claims handler colocado durante 3 aos como mximo. Inyeccin anticonceptiva Son inyecciones que se aplican cada 3 meses. Pldoras anticonceptivas Esta es una pldora que se toma todos [place]. Debe tomarla a la [employer]. Parche anticonceptivo Este es un parche que se coloca sobre la piel. Se debe cambiar 1 vez por semana durante 3 semanas. Despus de [employer], el parche se debe retirar durante 1semana. Anillo vaginal  Este es un anillo de plstico blando que se coloca dentro de la vagina. El anillo se deja colocado durante 3 semanas. Luego, se debe retirar durante 1 semana. Despus se coloca un nuevo anillo. Mtodos de barrera  Preservativo masculino Este es una cubierta delgada que se coloca sobre el pene antes de tener sexo. El preservativo se desecha despus de Doctor, hospital. Preservativo femenino Este es una cubierta blanda y suelta que se coloca en la vagina antes de [place]. El preservativo se desecha despus de Doctor, hospital. Diafragma El diafragma es una barrera blanda con forma de tazn. Debe estar hecho para adaptarse a su cuerpo. Se coloca en la vagina antes de tener sexo con una sustancia qumica que destruye los espermatozoides llamada espermicida. El Designer, fashion/clothing en la vagina durante 6 a 8 horas despus de tener sexo y debe retirarse en un plazo de 24 horas. El diafragma se debe reemplazar: Cada 1 o 2 aos. Despus de dar a luz. Despus de aumentar ms de 15  libras (6.8kg). Si se somete a una ciruga en la pelvis. Capuchn cervical Este es un capuchn pequeo y [place] se fija sobre el cuello uterino. El cuello uterino es la parte ms baja del tero. Se coloca en la vagina antes del sexo, junto con un espermicida. El capuchn debe fabricarse para usted. El capuchn se debe dejar colocado durante 6 a 8horas despus del sexo. Se debe retirar en un plazo de 48horas. El capuchn cervical debe ser recetado y adaptado a su cuerpo por un mdico. Debe reemplazarse cada 2aos. Esponja Esta es una esponja pequea que se coloca en la vagina antes de [place]. Se debe dejar colocada durante al menos 6horas despus de [employer]. Se debe retirar en un plazo de 30horas y desecharse. Espermicidas Estos son sustancias qumicas que destruyen o impiden que los espermatozoides ingresen al tero. Se pueden presentar en forma de pldora, crema, gel o espuma que se debe colocar en la vagina. Se deben usar al [employer] de 10 a antes de [employer]. Dispositivo intrauterino Un dispositivo intrauterino (DIU) es un dispositivo que un mdico coloca dentro del tero. Existen dos tipos: DIU hormonal. Este tipo puede permanecer colocado durante 3 a 5aos. DIU de cobre. Este tipo Insurance claims handler colocado durante 10aos. Mtodos anticonceptivos permanentes Ligadura de trompas en la mujer Esto es una ciruga para obstruir las trompas de [place]. Esterilizacin histeroscpica Este es un procedimiento para Scientific laboratory technician un dispositivo en cada trompa de [place]. Este mtodo funciona al cabo de 3 meses. Se deben usar otros mtodos anticonceptivos durante 3 meses. Esterilizacin masculina Esta es una [place], llamada vasectoma, para ligar los conductos  que transportan los espermatozoides en los hombres. Este mtodo funciona al cabo de 3 meses. Se deben usar otros mtodos anticonceptivos durante 3 meses. Mtodos de planificacin natural Esto significa no tener [employer] la pareja femenina podra quedar embarazada. A continuacin se mencionan algunos mtodos anticonceptivos por planificacin natural: Usar un calendario a fin de: Hacer un seguimiento de la duracin de cada ciclo menstrual. Determinar en [employer] se podra producir Firefighter. Planificar no tener [employer] en que se podra producir Firefighter. Reconocer los signos de la ovulacin y no tener relaciones sexuales durante ese perodo. La pareja femenina puede detectar cundo ser la ovulacin haciendo un seguimiento de su temperatura todos [place]. Tambin puede examinar si hay cambios en la mucosidad que proviene del cuello uterino. Dnde obtener ms informacin Centers for Disease Control and Prevention (Centros para el Control y la Prevencin de [place]): TonerPromos.no Esta informacin no tiene Theme park manager el consejo del mdico. Asegrese de hacerle al mdico cualquier pregunta que tenga. Document Revised: 11/22/2022 Document Reviewed: 11/22/2022 Elsevier Patient Education  2024 [employer].

## 2023-06-28 LAB — CBC
Hematocrit: 35.5 % (ref 34.0–46.6)
Hemoglobin: 11.1 g/dL (ref 11.1–15.9)
MCH: 26.4 pg — ABNORMAL LOW (ref 26.6–33.0)
MCHC: 31.3 g/dL — ABNORMAL LOW (ref 31.5–35.7)
MCV: 84 fL (ref 79–97)
Platelets: 296 10*3/uL (ref 150–450)
RBC: 4.21 x10E6/uL (ref 3.77–5.28)
RDW: 14.7 % (ref 11.7–15.4)
WBC: 8.3 10*3/uL (ref 3.4–10.8)

## 2023-06-28 LAB — RPR: RPR Ser Ql: NONREACTIVE

## 2023-06-28 LAB — HIV ANTIBODY (ROUTINE TESTING W REFLEX): HIV Screen 4th Generation wRfx: NONREACTIVE

## 2023-07-10 NOTE — Progress Notes (Unsigned)
   PRENATAL VISIT NOTE  Subjective:  Jacqueline Watkins is a 37 y.o. [phone] at [redacted]w[redacted]d being seen today for ongoing prenatal care.  She is currently monitored for the following issues for this high-risk pregnancy and has Adenomatous polyp of ascending colon; Abnormal colonoscopy; Abnormal CT scan, colon; Supervision of high risk pregnancy, antepartum; History of C-section; History of VBAC x2; GDM (gestational diabetes mellitus); AMA (advanced maternal age) multigravida 35+; Obesity affecting pregnancy, antepartum; and H. pylori infection on their problem list.  Patient reports {[id number]}.   .  .   . Denies leaking of fluid.   The following portions of the patient's history were reviewed and updated as appropriate: allergies, current medications, past family history, past medical history, past social history, past surgical history and problem list.   Objective:  There were no vitals filed for this visit.  Fetal Status:           General:  Alert, oriented and cooperative. Patient is in no acute distress.  Skin: Skin is warm and dry. No rash noted.   Cardiovascular: Normal heart rate noted  Respiratory: Normal respiratory effort, no problems with respiration noted  Abdomen: Soft, gravid, appropriate for gestational age.        Pelvic: Cervical exam deferred        Extremities: Normal range of motion.     Mental Status: Normal mood and affect. Normal behavior. Normal judgment and thought content.   Assessment and Plan:  Pregnancy: [id number] at [redacted]w[redacted]d 1. Diet controlled gestational diabetes mellitus (GDM) in third trimester Log reviewed: *** Growth wnl 10/31 - 36%ile, normal AC and AFI. Compound presentation.   2. Supervision of high risk pregnancy, antepartum 28w labs wnl.   3. Obesity affecting pregnancy, antepartum, unspecified obesity type  4. History of VBAC x2 S/p consent  5. Multigravida of advanced maternal age in third trimester ldASA  6. Pregnancy with 30 completed weeks  gestation   Preterm labor symptoms and general obstetric precautions including but not limited to vaginal bleeding, contractions, leaking of fluid and fetal movement were reviewed in detail with the patient. Please refer to After Visit Summary for other counseling recommendations.   No follow-ups on file.  Future Appointments  Date Time Provider Department Center  07/11/2023  2:35 PM Milas Hock, MD [facility]  08/01/2023 10:15 AM [facility] NURSE [facility]  08/01/2023 10:30 AM [facility] US3 [facility]    Milas Hock, MD

## 2023-07-11 ENCOUNTER — Ambulatory Visit (INDEPENDENT_AMBULATORY_CARE_PROVIDER_SITE_OTHER): Payer: Self-pay | Admitting: Obstetrics and Gynecology

## 2023-07-11 ENCOUNTER — Encounter: Payer: Self-pay | Admitting: Obstetrics and Gynecology

## 2023-07-11 ENCOUNTER — Other Ambulatory Visit: Payer: Self-pay

## 2023-07-11 VITALS — BP 110/74 | HR 98 | Wt 165.0 lb

## 2023-07-11 DIAGNOSIS — R1011 Right upper quadrant pain: Secondary | ICD-10-CM

## 2023-07-11 DIAGNOSIS — Z3A30 30 weeks gestation of pregnancy: Secondary | ICD-10-CM

## 2023-07-11 DIAGNOSIS — O99210 Obesity complicating pregnancy, unspecified trimester: Secondary | ICD-10-CM

## 2023-07-11 DIAGNOSIS — O24410 Gestational diabetes mellitus in pregnancy, diet controlled: Secondary | ICD-10-CM

## 2023-07-11 DIAGNOSIS — O0990 Supervision of high risk pregnancy, unspecified, unspecified trimester: Secondary | ICD-10-CM

## 2023-07-11 DIAGNOSIS — O09523 Supervision of elderly multigravida, third trimester: Secondary | ICD-10-CM

## 2023-07-11 DIAGNOSIS — O99213 Obesity complicating pregnancy, third trimester: Secondary | ICD-10-CM

## 2023-07-11 DIAGNOSIS — O0993 Supervision of high risk pregnancy, unspecified, third trimester: Secondary | ICD-10-CM

## 2023-07-11 DIAGNOSIS — Z98891 History of uterine scar from previous surgery: Secondary | ICD-10-CM

## 2023-07-12 LAB — COMPREHENSIVE METABOLIC PANEL
ALT: 9 [IU]/L (ref 0–32)
AST: 10 [IU]/L (ref 0–40)
Albumin: 3.7 g/dL — ABNORMAL LOW (ref 3.9–4.9)
Alkaline Phosphatase: 161 [IU]/L — ABNORMAL HIGH (ref 44–121)
BUN/Creatinine Ratio: 15 (ref 9–23)
BUN: 11 mg/dL (ref 6–20)
Bilirubin Total: <0.2 mg/dL (ref 0.0–1.2)
CO2: 13 mmol/L — ABNORMAL LOW (ref 20–29)
Calcium: 8.9 mg/dL (ref 8.7–10.2)
Chloride: 108 mmol/L — ABNORMAL HIGH (ref 96–106)
Creatinine, Ser: 0.71 mg/dL (ref 0.57–1.00)
Globulin, Total: 3 g/dL (ref 1.5–4.5)
Glucose: 69 mg/dL — ABNORMAL LOW (ref 70–99)
Potassium: 3.6 mmol/L (ref 3.5–5.2)
Sodium: 137 mmol/L (ref 134–144)
Total Protein: 6.7 g/dL (ref 6.0–8.5)
eGFR: 112 mL/min/{1.73_m2} (ref >59)

## 2023-07-12 LAB — LIPASE: Lipase: 39 U/L (ref 14–72)

## 2023-07-12 LAB — AMYLASE: Amylase: 92 U/L (ref 31–110)

## 2023-07-15 ENCOUNTER — Telehealth: Payer: Self-pay

## 2023-07-15 NOTE — Telephone Encounter (Addendum)
-----   Message from Milas Hock sent at 07/15/2023 10:25 AM EST ----- Pt had RUQ pain at appointment. Labs all normal, please let her know. We talked about a RUQ ultrasound, but she does not have insurance. However, if she would still like to have one, we can order one.  Thanks, pad   Called pt with CAP interpreter [place]. Reviewed results. Offered [place] which patient would like to defer for now. Will continue monitoring symptoms and will contact the office if she would like to pursue [place].

## 2023-07-30 ENCOUNTER — Other Ambulatory Visit: Payer: Self-pay

## 2023-07-30 ENCOUNTER — Ambulatory Visit (INDEPENDENT_AMBULATORY_CARE_PROVIDER_SITE_OTHER): Payer: Self-pay | Admitting: Family Medicine

## 2023-07-30 VITALS — BP 114/82 | HR 90 | Wt 161.9 lb

## 2023-07-30 DIAGNOSIS — O24410 Gestational diabetes mellitus in pregnancy, diet controlled: Secondary | ICD-10-CM

## 2023-07-30 DIAGNOSIS — Z98891 History of uterine scar from previous surgery: Secondary | ICD-10-CM

## 2023-07-30 DIAGNOSIS — E669 Obesity, unspecified: Secondary | ICD-10-CM

## 2023-07-30 DIAGNOSIS — Z3A32 32 weeks gestation of pregnancy: Secondary | ICD-10-CM

## 2023-07-30 DIAGNOSIS — O99210 Obesity complicating pregnancy, unspecified trimester: Secondary | ICD-10-CM

## 2023-07-30 DIAGNOSIS — O0990 Supervision of high risk pregnancy, unspecified, unspecified trimester: Secondary | ICD-10-CM

## 2023-07-30 DIAGNOSIS — O34219 Maternal care for unspecified type scar from previous cesarean delivery: Secondary | ICD-10-CM

## 2023-07-30 DIAGNOSIS — O99213 Obesity complicating pregnancy, third trimester: Secondary | ICD-10-CM

## 2023-07-30 NOTE — Progress Notes (Signed)
   PRENATAL VISIT NOTE  Subjective:  Jacqueline Watkins is a 37 y.o. [phone] at 104w5d being seen today for ongoing prenatal care.  She is currently monitored for the following issues for this high-risk pregnancy and has Adenomatous polyp of ascending colon; Abnormal colonoscopy; Abnormal CT scan, colon; Supervision of high risk pregnancy, antepartum; History of C-section; History of VBAC x2; GDM (gestational diabetes mellitus); AMA (advanced maternal age) multigravida 35+; Obesity affecting pregnancy, antepartum; and H. pylori infection on their problem list.  Patient reports no bleeding, no contractions, no cramping, and no leaking.  Contractions: Not present. Vag. Bleeding: None.  Movement: Present. Denies leaking of fluid.   The following portions of the patient's history were reviewed and updated as appropriate: allergies, current medications, past family history, past medical history, past social history, past surgical history and problem list.   Objective:   Vitals:   07/30/23 0900  BP: 114/82  Pulse: 90  Weight: 161 lb 14.4 oz (73.4 kg)    Fetal Status: Fetal Heart Rate (bpm): 137   Movement: Present     General:  Alert, oriented and cooperative. Patient is in no acute distress.  Skin: Skin is warm and dry. No rash noted.   Cardiovascular: Normal heart rate noted  Respiratory: Normal respiratory effort, no problems with respiration noted  Abdomen: Soft, gravid, appropriate for gestational age.  Pain/Pressure: Absent     Pelvic: Cervical exam deferred        Extremities: Normal range of motion.  Edema: None  Mental Status: Normal mood and affect. Normal behavior. Normal judgment and thought content.   Assessment and Plan:  Pregnancy: [id number] at [redacted]w[redacted]d 1. Supervision of high risk pregnancy, antepartum FHR and BP appropriate today Continue routine prenatal care  2. Diet controlled gestational diabetes mellitus (GDM) in third trimester Blood sugars well-controlled.  Glucose  log Replaced patient's glucose log Patient scheduled for growth ultrasound on 12/5  3. Obesity affecting pregnancy, antepartum, unspecified obesity type Continue monitoring per MFM  4. History of VBAC x2 Planning on TOLAC  5. [redacted] weeks gestation of pregnancy   Preterm labor symptoms and general obstetric precautions including but not limited to vaginal bleeding, contractions, leaking of fluid and fetal movement were reviewed in detail with the patient. Please refer to After Visit Summary for other counseling recommendations.   Return in about 2 weeks (around 08/13/2023) for HROB.  Future Appointments  Date Time Provider Department Center  08/01/2023 10:15 AM [facility] NURSE [facility]  08/01/2023 10:30 AM [facility] US3 [facility]    Celedonio Savage, MD

## 2023-08-01 ENCOUNTER — Other Ambulatory Visit: Payer: Self-pay | Admitting: *Deleted

## 2023-08-01 ENCOUNTER — Ambulatory Visit: Payer: Self-pay | Attending: Obstetrics and Gynecology

## 2023-08-01 ENCOUNTER — Ambulatory Visit: Payer: Self-pay | Admitting: *Deleted

## 2023-08-01 ENCOUNTER — Other Ambulatory Visit: Payer: Self-pay

## 2023-08-01 VITALS — BP 115/73 | HR 91

## 2023-08-01 DIAGNOSIS — O09523 Supervision of elderly multigravida, third trimester: Secondary | ICD-10-CM | POA: Insufficient documentation

## 2023-08-01 DIAGNOSIS — O0990 Supervision of high risk pregnancy, unspecified, unspecified trimester: Secondary | ICD-10-CM

## 2023-08-01 DIAGNOSIS — O24410 Gestational diabetes mellitus in pregnancy, diet controlled: Secondary | ICD-10-CM | POA: Insufficient documentation

## 2023-08-01 DIAGNOSIS — O09213 Supervision of pregnancy with history of pre-term labor, third trimester: Secondary | ICD-10-CM

## 2023-08-01 DIAGNOSIS — O99213 Obesity complicating pregnancy, third trimester: Secondary | ICD-10-CM

## 2023-08-01 DIAGNOSIS — Z3A33 33 weeks gestation of pregnancy: Secondary | ICD-10-CM

## 2023-08-01 DIAGNOSIS — O34219 Maternal care for unspecified type scar from previous cesarean delivery: Secondary | ICD-10-CM

## 2023-08-01 DIAGNOSIS — E669 Obesity, unspecified: Secondary | ICD-10-CM

## 2023-08-16 ENCOUNTER — Other Ambulatory Visit: Payer: Self-pay

## 2023-08-16 ENCOUNTER — Ambulatory Visit: Payer: Self-pay | Admitting: Obstetrics and Gynecology

## 2023-08-16 ENCOUNTER — Other Ambulatory Visit (HOSPITAL_COMMUNITY)
Admission: RE | Admit: 2023-08-16 | Discharge: 2023-08-16 | Disposition: A | Discharge status: Home / Self Care | Payer: Self-pay | Source: Ambulatory Visit | Attending: Obstetrics and Gynecology | Admitting: Obstetrics and Gynecology

## 2023-08-16 VITALS — BP 115/73 | HR 106 | Wt 165.0 lb

## 2023-08-16 DIAGNOSIS — R300 Dysuria: Secondary | ICD-10-CM

## 2023-08-16 DIAGNOSIS — O0993 Supervision of high risk pregnancy, unspecified, third trimester: Secondary | ICD-10-CM

## 2023-08-16 DIAGNOSIS — Z98891 History of uterine scar from previous surgery: Secondary | ICD-10-CM

## 2023-08-16 DIAGNOSIS — N898 Other specified noninflammatory disorders of vagina: Secondary | ICD-10-CM

## 2023-08-16 DIAGNOSIS — O34219 Maternal care for unspecified type scar from previous cesarean delivery: Secondary | ICD-10-CM

## 2023-08-16 DIAGNOSIS — O0990 Supervision of high risk pregnancy, unspecified, unspecified trimester: Secondary | ICD-10-CM

## 2023-08-16 DIAGNOSIS — O99213 Obesity complicating pregnancy, third trimester: Secondary | ICD-10-CM

## 2023-08-16 DIAGNOSIS — O99210 Obesity complicating pregnancy, unspecified trimester: Secondary | ICD-10-CM

## 2023-08-16 DIAGNOSIS — E669 Obesity, unspecified: Secondary | ICD-10-CM

## 2023-08-16 DIAGNOSIS — O24410 Gestational diabetes mellitus in pregnancy, diet controlled: Secondary | ICD-10-CM

## 2023-08-16 DIAGNOSIS — Z3A35 35 weeks gestation of pregnancy: Secondary | ICD-10-CM

## 2023-08-16 LAB — POCT URINALYSIS DIP (DEVICE)
Bilirubin Urine: NEGATIVE
Glucose, UA: NEGATIVE mg/dL
Ketones, ur: NEGATIVE mg/dL
Nitrite: NEGATIVE
Protein, ur: 30 mg/dL — AB
Specific Gravity, Urine: 1.015 (ref 1.005–1.030)
Urobilinogen, UA: 0.2 mg/dL (ref 0.0–1.0)
pH: 7 (ref 5.0–8.0)

## 2023-08-16 MED ORDER — NITROFURANTOIN MONOHYD MACRO 100 MG PO CAPS: 100.0000 mg | ORAL_CAPSULE | Freq: Two times a day (BID) | ORAL | 0 refills | Status: DC

## 2023-08-16 NOTE — Patient Instructions (Signed)

## 2023-08-16 NOTE — Progress Notes (Signed)
   PRENATAL VISIT NOTE  Subjective:  Jacqueline Watkins is a 37 y.o. [phone] at [redacted]w[redacted]d being seen today for ongoing prenatal care.  She is currently monitored for the following issues for this high-risk pregnancy and has Adenomatous polyp of ascending colon; Abnormal colonoscopy; Abnormal CT scan, colon; Supervision of high risk pregnancy, antepartum; History of C-section; History of VBAC x2; GDM (gestational diabetes mellitus); AMA (advanced maternal age) multigravida 35+; Obesity affecting pregnancy, antepartum; and H. pylori infection on their problem list.  Patient reports  lower back pain , dysuria, and vaginal itching  .  Contractions: Not present. Vag. Bleeding: None.  Movement: Present. Denies leaking of fluid.   The following portions of the patient's history were reviewed and updated as appropriate: allergies, current medications, past family history, past medical history, past social history, past surgical history and problem list.   Objective:   Vitals:   08/16/23 0921  BP: 115/73  Pulse: (!) 106  Weight: 165 lb (74.8 kg)    Fetal Status: Fetal Heart Rate (bpm): 130   Movement: Present     General:  Alert, oriented and cooperative. Patient is in no acute distress.  Skin: Skin is warm and dry. No rash noted.   Cardiovascular: Normal heart rate noted  Respiratory: Normal respiratory effort, no problems with respiration noted  Abdomen: Soft, gravid, appropriate for gestational age.  Pain/Pressure: Absent     Pelvic: Cervical exam deferred        Extremities: Normal range of motion.  Edema: None  Mental Status: Normal mood and affect. Normal behavior. Normal judgment and thought content.   Assessment and Plan:  Pregnancy: [id number] at [redacted]w[redacted]d 1. Supervision of high risk pregnancy, antepartum (Primary) BP and FHR normal Doing well, feeling regular movement    2. Diet controlled gestational diabetes mellitus (GDM) in third trimester Well controlled with diet 12/5 u/s afi  normal, EFW 37%, follow up scheduled 1/6  3. Obesity affecting pregnancy, antepartum, unspecified obesity type Following MFM  4. History of VBAC x2 Planning tolac, consent signed previously  5. [redacted] weeks gestation of pregnancy Anticipatory guidance regarding swabs next visit List given for pediatrics, is trying to find a new place for current children as well RSV letter given for [facility]  6. Vaginal discharge Swab collected today   - Cervicovaginal ancillary only( [place])  7. Dysuria Given symptoms, significant leuks on UA, will send tx while waiting for culture. Will follow up with results - Culture, OB Urine - nitrofurantoin, macrocrystal-monohydrate, (MACROBID) 100 MG capsule; Take 1 capsule (100 mg total) by mouth 2 (two) times daily.  Dispense: 14 capsule; Refill: 0   Preterm labor symptoms and general obstetric precautions including but not limited to vaginal bleeding, contractions, leaking of fluid and fetal movement were reviewed in detail with the patient. Please refer to After Visit Summary for other counseling recommendations.   Return in about 1 week (around 08/23/2023) for OB VISIT (MD or APP). Future Appointments  Date Time Provider Department Center  08/26/2023  3:15 PM Damar Bing, MD [facility]  09/02/2023 10:15 AM [facility] NURSE [facility]  09/02/2023 10:30 AM [facility] US1 [facility]      Albertine Grates, FNP

## 2023-08-19 LAB — CULTURE, OB URINE

## 2023-08-19 LAB — URINE CULTURE, OB REFLEX

## 2023-08-20 LAB — CERVICOVAGINAL ANCILLARY ONLY
Bacterial Vaginitis (gardnerella): NEGATIVE
Candida Glabrata: NEGATIVE
Candida Vaginitis: POSITIVE — AB
Chlamydia: NEGATIVE
Comment: NEGATIVE
Comment: NEGATIVE
Comment: NEGATIVE
Comment: NEGATIVE
Comment: NEGATIVE
Comment: NORMAL
Neisseria Gonorrhea: NEGATIVE
Trichomonas: NEGATIVE

## 2023-08-23 ENCOUNTER — Telehealth: Payer: Self-pay

## 2023-08-23 MED ORDER — FLUCONAZOLE 150 MG PO TABS: 150.0000 mg | ORAL_TABLET | Freq: Once | ORAL | 0 refills | Status: AC

## 2023-08-23 NOTE — Telephone Encounter (Signed)
Attempted to call patient again with assistance of Gershon Cull, in house Research officer, trade union. Spoke with patient to inform her that Diflucan sent to pharmacy to treat yeast infection. Reviewed can repeat in 3 days is symptoms persist. Patient questions answered. She voiced understanding.

## 2023-08-23 NOTE — Addendum Note (Signed)
Addended by: Sue Lush on: 08/23/2023 08:26 AM   Modules accepted: Orders

## 2023-08-23 NOTE — Telephone Encounter (Addendum)
-----   Message from [facility] sent at 08/23/2023  8:25 AM EST ----- Can we call and let this patient know I sent in medication to treat the yeast. Thanks!  Attempted to contact pt with Spanish Interpreter Gershon Cull unable to leave message due to message stating "please try you call again later".    Leonette Nutting

## 2023-08-26 ENCOUNTER — Ambulatory Visit (INDEPENDENT_AMBULATORY_CARE_PROVIDER_SITE_OTHER): Payer: Self-pay | Admitting: Obstetrics and Gynecology

## 2023-08-26 ENCOUNTER — Other Ambulatory Visit: Payer: Self-pay

## 2023-08-26 ENCOUNTER — Other Ambulatory Visit (HOSPITAL_COMMUNITY)
Admission: RE | Admit: 2023-08-26 | Discharge: 2023-08-26 | Disposition: A | Discharge status: Home / Self Care | Payer: Self-pay | Source: Ambulatory Visit | Attending: Obstetrics and Gynecology | Admitting: Obstetrics and Gynecology

## 2023-08-26 VITALS — BP 121/85 | HR 97 | Wt 164.0 lb

## 2023-08-26 DIAGNOSIS — Z603 Acculturation difficulty: Secondary | ICD-10-CM

## 2023-08-26 DIAGNOSIS — N761 Subacute and chronic vaginitis: Secondary | ICD-10-CM

## 2023-08-26 DIAGNOSIS — R933 Abnormal findings on diagnostic imaging of other parts of digestive tract: Secondary | ICD-10-CM

## 2023-08-26 DIAGNOSIS — Z758 Other problems related to medical facilities and other health care: Secondary | ICD-10-CM

## 2023-08-26 DIAGNOSIS — O0990 Supervision of high risk pregnancy, unspecified, unspecified trimester: Secondary | ICD-10-CM | POA: Insufficient documentation

## 2023-08-26 DIAGNOSIS — Z3A36 36 weeks gestation of pregnancy: Secondary | ICD-10-CM

## 2023-08-26 DIAGNOSIS — O24410 Gestational diabetes mellitus in pregnancy, diet controlled: Secondary | ICD-10-CM

## 2023-08-26 DIAGNOSIS — O0993 Supervision of high risk pregnancy, unspecified, third trimester: Secondary | ICD-10-CM

## 2023-08-26 DIAGNOSIS — Z98891 History of uterine scar from previous surgery: Secondary | ICD-10-CM

## 2023-08-26 DIAGNOSIS — O09523 Supervision of elderly multigravida, third trimester: Secondary | ICD-10-CM

## 2023-08-26 NOTE — Progress Notes (Signed)
    PRENATAL VISIT NOTE  Subjective:  Jacqueline Watkins is a 37 y.o. [phone] at [redacted]w[redacted]d being seen today for ongoing prenatal care.  She is currently monitored for the following issues for this high-risk pregnancy and has Adenomatous polyp of ascending colon; Abnormal colonoscopy; Abnormal CT scan, colon; Supervision of high risk pregnancy, antepartum; History of C-section; History of VBAC x2; GDM (gestational diabetes mellitus); AMA (advanced maternal age) multigravida 35+; Obesity affecting pregnancy, antepartum; H. pylori infection; and Language barrier on their problem list.  Patient reports no complaints.  Contractions: Not present. Vag. Bleeding: None.  Movement: Present. Denies leaking of fluid.   The following portions of the patient's history were reviewed and updated as appropriate: allergies, current medications, past family history, past medical history, past social history, past surgical history and problem list.   Objective:   Vitals:   08/26/23 1524  BP: 121/85  Pulse: 97  Weight: 164 lb (74.4 kg)    Fetal Status: Fetal Heart Rate (bpm): 145   Movement: Present     General:  Alert, oriented and cooperative. Patient is in no acute distress.  Skin: Skin is warm and dry. No rash noted.   Cardiovascular: Normal heart rate noted  Respiratory: Normal respiratory effort, no problems with respiration noted  Abdomen: Soft, gravid, appropriate for gestational age.  Pain/Pressure: Present     Pelvic: Cervical exam deferred        Extremities: Normal range of motion.  Edema: None  Mental Status: Normal mood and affect. Normal behavior. Normal judgment and thought content.   Assessment and Plan:  Pregnancy: [id number] at [redacted]w[redacted]d 1. Supervision of high risk pregnancy, antepartum (Primary) Self pay patient - Cervicovaginal ancillary only( [place]) - Culture, beta strep (group b only) - POCT CBG monitoring; Standing - CBC; Standing - Type and screen; Standing - RPR;  Standing  2. Chronic vaginitis Swab today  3. [redacted] weeks gestation of pregnancy - POCT CBG monitoring; Standing - CBC; Standing - Type and screen; Standing - RPR; Standing  4. Diet controlled gestational diabetes mellitus (GDM) in third trimester Patient prefers latest IOL date>>pt set up for 1/22 at 2345 AM fasting wnl 2h PP breakfast wnl 2h lunch wnl but a few  Patient states she doesn't eat dinner b/c of bad heartburn (currently on pepcid) Recommend watching after lunch numbers and post prandial walk 12/5: 37%, 2071g, ac 67%, afi 16, ceph>> follow up repeat surveillance growth u/s - POCT CBG monitoring; Standing - CBC; Standing - Type and screen; Standing - RPR; Standing  5. History of VBAC x2 [place] consent already signed  6. Multigravida of advanced maternal age in third trimester No issues  7. Language barrier In person interpreter used  8. GI  Needs GI PP follow up  Preterm labor symptoms and general obstetric precautions including but not limited to vaginal bleeding, contractions, leaking of fluid and fetal movement were reviewed in detail with the patient. Please refer to After Visit Summary for other counseling recommendations.   Return in 1 week (on 09/02/2023) for in person, high risk ob, md or app.  Future Appointments  Date Time Provider Department Center  09/02/2023 10:15 AM [facility] NURSE [facility]  09/02/2023 10:30 AM [facility] US1 [facility]    [place], MD

## 2023-08-28 NOTE — L&D Delivery Note (Signed)
OB/GYN Faculty Practice Delivery Note  Jacqueline Watkins is a 38 y.o. [id number] s/p SVD at [redacted]w[redacted]d. She was admitted for IOL for A1GDM.   ROM: 1h 43m with light MSF GBS Status: Positive/-- (12/30 1626) Maximum Maternal Temperature: Temp (24hrs), Avg:98 F (36.7 C), Min:97.9 F (36.6 C), Max:98.1 F (36.7 C)   Labor Progress: Initial SVE: 3.5/50/-1. AROM and Pitocin required. She then progressed to complete.   Delivery Date/Time: 09/19/23 @0748  Delivery: Called to room due to fetal bradycardia. Cervical check was done and patient was complete. Patient started pushing. Head delivered OA to LOA. nuchal x1 present which was delivered through . Shoulder and body delivered in usual fashion. Infant with spontaneous cry, placed on mother's abdomen, dried and stimulated. Cord clamped x 2 after +1-minute delay, and cut by FOB. Cord gases not obtained. Cord blood drawn. Placenta delivered spontaneously with gentle cord traction. Fundus firm with massage and Pitocin. Labia, perineum, and vagina inspected with no lacerations present. Mom and baby to postpartum. Baby Weight: pending  Placenta: 3 vessel, intact. Sent to L&D Complications: None Lacerations: none EBL: 124 mL Anesthesia: none  Infant: baby boy  APGAR (1 MIN):  8 APGAR (5 MINS):  9 APGAR (10 MINS):    Margie Billet, MD [facility] Family Medicine Fellow, [facility] for [facility], [facility] 09/19/2023, 8:09 AM

## 2023-08-29 LAB — CULTURE, BETA STREP (GROUP B ONLY): Strep Gp B Culture: POSITIVE — AB

## 2023-08-29 LAB — CERVICOVAGINAL ANCILLARY ONLY
Bacterial Vaginitis (gardnerella): NEGATIVE
Candida Glabrata: NEGATIVE
Candida Vaginitis: NEGATIVE
Chlamydia: NEGATIVE
Comment: NEGATIVE
Comment: NEGATIVE
Comment: NEGATIVE
Comment: NEGATIVE
Comment: NEGATIVE
Comment: NORMAL
Neisseria Gonorrhea: NEGATIVE
Trichomonas: NEGATIVE

## 2023-08-30 ENCOUNTER — Encounter: Payer: Self-pay | Admitting: Obstetrics and Gynecology

## 2023-08-30 DIAGNOSIS — O99820 Streptococcus B carrier state complicating pregnancy: Secondary | ICD-10-CM | POA: Insufficient documentation

## 2023-08-31 ENCOUNTER — Encounter (HOSPITAL_COMMUNITY): Payer: Self-pay | Admitting: Family Medicine

## 2023-08-31 ENCOUNTER — Inpatient Hospital Stay (HOSPITAL_COMMUNITY)
Admission: AD | Admit: 2023-08-31 | Discharge: 2023-09-01 | Disposition: A | Discharge status: Home / Self Care | Payer: Medicaid Other | Attending: Family Medicine | Admitting: Family Medicine

## 2023-08-31 DIAGNOSIS — O26833 Pregnancy related renal disease, third trimester: Secondary | ICD-10-CM | POA: Diagnosis not present

## 2023-08-31 DIAGNOSIS — Z3A37 37 weeks gestation of pregnancy: Secondary | ICD-10-CM | POA: Insufficient documentation

## 2023-08-31 DIAGNOSIS — B9689 Other specified bacterial agents as the cause of diseases classified elsewhere: Secondary | ICD-10-CM | POA: Diagnosis not present

## 2023-08-31 DIAGNOSIS — N289 Disorder of kidney and ureter, unspecified: Secondary | ICD-10-CM | POA: Diagnosis not present

## 2023-08-31 DIAGNOSIS — O2343 Unspecified infection of urinary tract in pregnancy, third trimester: Secondary | ICD-10-CM | POA: Diagnosis present

## 2023-08-31 DIAGNOSIS — O09523 Supervision of elderly multigravida, third trimester: Secondary | ICD-10-CM | POA: Diagnosis not present

## 2023-08-31 LAB — CBC WITH DIFFERENTIAL/PLATELET
Abs Immature Granulocytes: 0.04 10*3/uL (ref 0.00–0.07)
Basophils Absolute: 0.1 10*3/uL (ref 0.0–0.1)
Basophils Relative: 1 %
Eosinophils Absolute: 0.1 10*3/uL (ref 0.0–0.5)
Eosinophils Relative: 1 %
HCT: 32.3 % — ABNORMAL LOW (ref 36.0–46.0)
Hemoglobin: 10.1 g/dL — ABNORMAL LOW (ref 12.0–15.0)
Immature Granulocytes: 0 %
Lymphocytes Relative: 19 %
Lymphs Abs: 2 10*3/uL (ref 0.7–4.0)
MCH: 24.8 pg — ABNORMAL LOW (ref 26.0–34.0)
MCHC: 31.3 g/dL (ref 30.0–36.0)
MCV: 79.4 fL — ABNORMAL LOW (ref 80.0–100.0)
Monocytes Absolute: 0.8 10*3/uL (ref 0.1–1.0)
Monocytes Relative: 7 %
Neutro Abs: 7.9 10*3/uL — ABNORMAL HIGH (ref 1.7–7.7)
Neutrophils Relative %: 72 %
Platelets: 285 10*3/uL (ref 150–400)
RBC: 4.07 MIL/uL (ref 3.87–5.11)
RDW: 17.2 % — ABNORMAL HIGH (ref 11.5–15.5)
WBC: 10.9 10*3/uL — ABNORMAL HIGH (ref 4.0–10.5)
nRBC: 0 % (ref 0.0–0.2)

## 2023-08-31 LAB — URINALYSIS, ROUTINE W REFLEX MICROSCOPIC
Bilirubin Urine: NEGATIVE
Glucose, UA: NEGATIVE mg/dL
Hgb urine dipstick: NEGATIVE
Ketones, ur: NEGATIVE mg/dL
Nitrite: NEGATIVE
Protein, ur: NEGATIVE mg/dL
Specific Gravity, Urine: 1.003 — ABNORMAL LOW (ref 1.005–1.030)
pH: 7 (ref 5.0–8.0)

## 2023-08-31 LAB — COMPREHENSIVE METABOLIC PANEL
ALT: 12 U/L (ref 0–44)
AST: 11 U/L — ABNORMAL LOW (ref 15–41)
Albumin: 2.6 g/dL — ABNORMAL LOW (ref 3.5–5.0)
Alkaline Phosphatase: 257 U/L — ABNORMAL HIGH (ref 38–126)
Anion gap: 9 (ref 5–15)
BUN: 13 mg/dL (ref 6–20)
CO2: 14 mmol/L — ABNORMAL LOW (ref 22–32)
Calcium: 8.8 mg/dL — ABNORMAL LOW (ref 8.9–10.3)
Chloride: 114 mmol/L — ABNORMAL HIGH (ref 98–111)
Creatinine, Ser: 1.18 mg/dL — ABNORMAL HIGH (ref 0.44–1.00)
GFR, Estimated: >60 mL/min (ref >60)
Glucose, Bld: 91 mg/dL (ref 70–99)
Potassium: 4.4 mmol/L (ref 3.5–5.1)
Sodium: 137 mmol/L (ref 135–145)
Total Bilirubin: 0.3 mg/dL (ref 0.0–1.2)
Total Protein: 6.6 g/dL (ref 6.5–8.1)

## 2023-08-31 MED ORDER — SODIUM CHLORIDE 0.9 % IV SOLN
2.0000 g | Freq: Once | INTRAVENOUS | Status: AC
  Administered 2023-08-31: 2 g via INTRAVENOUS
  Filled 2023-08-31: qty 20

## 2023-08-31 MED ORDER — SODIUM CHLORIDE 0.9 % IV BOLUS
1000.0000 mL | Freq: Once | INTRAVENOUS | Status: AC
  Administered 2023-08-31: 1000 mL via INTRAVENOUS

## 2023-08-31 NOTE — MAU Note (Signed)
 Rosaleigh Brazzel is a 38 y.o. at [redacted]w[redacted]d here in MAU reporting: having a lot of pain in her lower abd and lower back. Pain is constant. Also having burning when she is peeing. Having frequency of urination, pressure with urination, doesn't always feel like she is emptying bladder.   Denies bleeding or LOF. Reports sometimes has small amt of white d/c.  Reports +FM  Onset of complaint: for a wk, but feels like it is increasing Pain score: abd 7, back 5 Vitals:   08/31/23 1855  BP: 127/76  Pulse: (!) 107  Resp: 18  Temp: 98.1 F (36.7 C)  SpO2: 99%     FHT:142 Lab orders placed from triage:  urine

## 2023-09-01 DIAGNOSIS — N289 Disorder of kidney and ureter, unspecified: Secondary | ICD-10-CM | POA: Diagnosis not present

## 2023-09-01 DIAGNOSIS — O2343 Unspecified infection of urinary tract in pregnancy, third trimester: Secondary | ICD-10-CM

## 2023-09-01 DIAGNOSIS — Z3A37 37 weeks gestation of pregnancy: Secondary | ICD-10-CM

## 2023-09-01 LAB — CULTURE, OB URINE: Culture: <10000 — AB

## 2023-09-01 MED ORDER — CEFADROXIL 500 MG PO CAPS: 500.0000 mg | ORAL_CAPSULE | Freq: Two times a day (BID) | ORAL | 0 refills | Status: AC

## 2023-09-01 NOTE — Discharge Instructions (Signed)
Razones para volver a MAU/[facility]:  1. Las contracciones son cada 5 minutos o menos, cada uno de los ltimos 1 minuto, stos han llevado a cabo durante 1-2 horas, y no se puede caminar o hablar durante ellas 2. Usted tiene un gran chorro de lquido o un goteo de lquido que no se detiene y hay que usar una toalla 3. Ha de sangrado que es de color rojo brillante, denso que el manchado - como sangrado menstrual (manchado puede ser normal en trabajo de parto prematuro o despus de una comprobacin de su cuello uterino) 4. Usted no se siente el beb se mueve como l / ella hace normalmente  

## 2023-09-01 NOTE — MAU Provider Note (Signed)
 Chief Complaint:  Abdominal Pain, Back Pain, and Dysuria (Burning with urination)   Event Date/Time   First Provider Initiated Contact with Patient 08/31/23 2005      HPI: Lexxi Watkins is a 38 y.o. [id number] at [redacted]w[redacted]d who presents to maternity admissions reporting pain in RLQ, right flank and right lower back, and dysuria with onset today.  She was recently treated for UTI, completed the abx and improved, but symptoms have returned.  Pain is constant, there is no intermittent pain or cramping. There is some nausea but no vomiting. She denies fever/chills.  She reports good fetal movement.   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   Constipation 02/02/2021   History of sexually transmitted disease 01/11/2015   Gonorrhea and chlamydia 4-5 years ago     Iron deficiency anemia 12/24/2018   Kidney stones 03/08/2015   Microcytic anemia 09/11/2018   Neutrophilia 12/06/2018   Removal Reason: Historical     Pyelonephritis    Sepsis due to urinary tract infection (HCC) 09/11/2018   Steatosis of liver 01/11/2015   Umbilical hernia 12/06/2018   Fat-containing [facility]      Past obstetric history: OB History  Gravida Para Term Preterm AB Living  5 3 2 1 1 3   SAB IAB Ectopic Multiple Live Births  1 0 0 0 3    # Outcome Date GA Lbr Len/2nd Weight Sex Type Anes PTL Lv  5 Current           4 SAB 11/04/20 [redacted]w[redacted]d   M SAB None Y FD     Complications: Preterm premature rupture of membranes (PPROM) with onset of labor within 24 hours of rupture in second trimester, antepartum  3 Term 06/01/11 [redacted]w[redacted]d 01:50 / 00:11 2586 g M VBAC None  LIV  2 Term 06/21/07 [redacted]w[redacted]d  3629 g M VBAC     1 Preterm 07/04/04 [redacted]w[redacted]d   M CS-LTranv   LIV     Birth Comments: Preterm ROM    Past Surgical History: Past Surgical History:  Procedure Laterality Date   BIOPSY  04/10/2021   Procedure: BIOPSY;  Surgeon: Wilhelmenia Aloha Raddle., MD;   Location: [facility];  Service: Gastroenterology;;   CESAREAN SECTION  2005   COLONOSCOPY     COLONOSCOPY WITH PROPOFOL  N/A 04/10/2021   Procedure: COLONOSCOPY WITH PROPOFOL ;  Surgeon: Wilhelmenia Aloha Raddle., MD;  Location: [facility];  Service: Gastroenterology;  Laterality: N/A;   ENDOSCOPIC MUCOSAL RESECTION N/A 04/10/2021   Procedure: ENDOSCOPIC MUCOSAL RESECTION;  Surgeon: Wilhelmenia Aloha Raddle., MD;  Location: [facility];  Service: Gastroenterology;  Laterality: N/A;   ESOPHAGOGASTRODUODENOSCOPY (EGD) WITH PROPOFOL  N/A 04/10/2021   Procedure: ESOPHAGOGASTRODUODENOSCOPY (EGD) WITH PROPOFOL ;  Surgeon: Wilhelmenia Aloha Raddle., MD;  Location: [facility];  Service: Gastroenterology;  Laterality: N/A;   HEMOSTASIS CLIP PLACEMENT  04/10/2021   Procedure: HEMOSTASIS CLIP PLACEMENT;  Surgeon: Wilhelmenia Aloha Raddle., MD;  Location: [facility];  Service: Gastroenterology;;   HOT HEMOSTASIS N/A 04/10/2021   Procedure: HOT HEMOSTASIS (ARGON PLASMA COAGULATION/BICAP);  Surgeon: Wilhelmenia Aloha Raddle., MD;  Location: [facility];  Service: Gastroenterology;  Laterality: N/A;   POLYPECTOMY  04/10/2021   Procedure: POLYPECTOMY;  Surgeon: Mansouraty, Aloha Raddle., MD;  Location: [facility];  Service: Gastroenterology;;    Family History: Family History  Problem Relation Age of Onset   Hypertension Mother    Colon cancer Neg Hx    Esophageal cancer Neg Hx  Inflammatory bowel disease Neg Hx    Liver disease Neg Hx    Pancreatic cancer Neg Hx    Rectal cancer Neg Hx    Stomach cancer Neg Hx     Social History: Social History   Tobacco Use   Smoking status: Never   Smokeless tobacco: Never  Vaping Use   Vaping status: Never Used  Substance Use Topics   Alcohol use: No   Drug use: No    Allergies:  Allergies  Allergen Reactions   Lactose Intolerance (Gi)     Meds:  No medications prior to admission.    ROS:  Review of Systems  Constitutional:  Negative for chills,  fatigue and fever.  Eyes:  Negative for visual disturbance.  Respiratory:  Negative for shortness of breath.   Cardiovascular:  Negative for chest pain.  Gastrointestinal:  Positive for abdominal pain. Negative for nausea and vomiting.  Genitourinary:  Positive for dysuria and flank pain. Negative for difficulty urinating, pelvic pain, vaginal bleeding, vaginal discharge and vaginal pain.  Neurological:  Negative for dizziness and headaches.  Psychiatric/Behavioral: Negative.       I have reviewed patient's Past Medical Hx, Surgical Hx, Family Hx, Social Hx, medications and allergies.   Physical Exam  No data found.  Constitutional: Well-developed, well-nourished female in no acute distress.  Cardiovascular: normal rate Respiratory: normal effort GI: Abd soft, non-tender, gravid appropriate for gestational age.  MS: Extremities nontender, no edema, normal ROM Neurologic: Alert and oriented x 4.  GU: Neg CVAT, mild tenderness in mid low back  PELVIC EXAM:   Dilation: 3.5 Effacement (%): 40 Station: -2, -3 Presentation: Vertex Exam by:: Olam Rollie Fuelling  FHT:  Baseline 135 , moderate variability, accelerations present, no decelerations Contractions: 5-10 minutes, mild to palpation   Labs: No results found for this or any previous visit (from the past 24 hours).  O/Positive/-- (07/15 0000)  Imaging:  US  MFM OB FOLLOW UP Result Date: 09/02/2023 ----------------------------------------------------------------------  OBSTETRICS REPORT                       (Signed Final 09/02/2023 12:00 pm) ---------------------------------------------------------------------- Patient Info  ID #:       [id number]                          D.O.B.:  [birth year] (37 yrs)(F)  Name:       Jacqueline CHRISTELLA DOME-               Visit Date: 09/02/2023 10:28 am              Watkins ---------------------------------------------------------------------- Performed By  Attending:        Fredia Fresh MD        Ref. Address:      [address]                                                             [place], [place]  [address]  Performed By:     Cassondra FORBES Jefferson BS,      [facility]  Referred By:      [facility] ---------------------------------------------------------------------- Orders  #  Description                           Code        Ordered By  1  US  MFM OB FOLLOW UP                   [phone]    FREDIA FRESH ----------------------------------------------------------------------  #  Order #                     Accession #                Episode #  1  [id number]                   [id number]                 [id number] ---------------------------------------------------------------------- Indications  Advanced maternal age multigravida 48+,        O75.523  third trimester  (37)  Gestational diabetes in pregnancy, diet        O24.410  controlled  Obesity complicating pregnancy, third          O99.213  trimester  Poor obstetric history: Previous preterm       O09.219  delivery, antepartum (32 wk PPROM)  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 Cord Insertion:      Previously seen   Amniotic Fluid  AFI FV:      Within normal limits  AFI Sum(cm)     %Tile       Largest Pocket(cm)  16.64           64          7.84  RUQ(cm)       RLQ(cm)       LUQ(cm)        LLQ(cm)  7.84          2.35          4.65           1.8 ---------------------------------------------------------------------- Biometry  BPD:      89.2  mm     G. Age:  36w 1d         29  %    CI:        74.27   %    70 - 86                                                          FL/HC:      21.4   %    20.9 - 22.7  HC:      328.6  mm     G. Age:  37w 2d         22  %    HC/AC:      0.96        0.92 - 1.05  AC:      341.9  mm     G. Age:  38w 1d         79  %    FL/BPD:     78.8   %    71 - 87  FL:       70.3  mm     G. Age:  36w 0d         16  %    FL/AC:      20.6   %    20 - 24  Est. FW:    3170  gm           7 lb     52  % ---------------------------------------------------------------------- OB History  Gravidity:    5         Term:   2        Prem:   1        SAB:   1  TOP:          0       Ectopic:  0        Living: 3 ---------------------------------------------------------------------- Gestational Age  LMP:           37w 4d        Date:  12/13/22                  EDD:   09/19/23  U/S Today:     36w 6d                                        EDD:   09/24/23  Best:  37w 4d     Det. By:  LMP  (12/13/22)          EDD:   09/19/23 ---------------------------------------------------------------------- Anatomy  Heart:                 Appears normal         Kidneys:                Appear normal                         (4CH, axis, and                         situs)  Diaphragm:             Appears normal         Bladder:                Appears normal  Stomach:               Appears normal, left                         sided ---------------------------------------------------------------------- Cervix Uterus Adnexa  Cervix  Not visualized (advanced GA >24wks) ---------------------------------------------------------------------- Impression   Fetal growth is appropriate for gestational age .Amniotic fluid  is normal and good fetal activity is seen . Cephalic  presentation.  Patient will be undergoing induction of labor on 09/19/23. ---------------------------------------------------------------------- Recommendations  -No follow-up appointments were made . ----------------------------------------------------------------------                 Fredia Fresh, MD Electronically Signed Final Report   09/02/2023 12:00 pm ----------------------------------------------------------------------    MAU Course/MDM: Orders Placed This Encounter  Procedures   Culture, OB Urine   Urinalysis, Routine w reflex microscopic -Urine, Clean Catch   CBC with Differential/Platelet   Comprehensive metabolic panel   Discharge patient    Meds ordered this encounter  Medications   cefTRIAXone  (ROCEPHIN ) 2 g in sodium chloride  0.9 % 100 mL IVPB    Antibiotic Indication::   UTI   sodium chloride  0.9 % bolus 1,000 mL   cefadroxil  (DURICEF) 500 MG capsule    Sig: Take 1 capsule (500 mg total) by mouth 2 (two) times daily for 7 days.    Dispense:  14 capsule    Refill:  0    Supervising Provider:   FREDIRICK GLENYS RAMAN [[id number]]     NST reviewed and reactive Large leukocytes in UA, recent UTI with return of symptoms so presumptively treated as UTI today Given hx pyelonephritis and current abnormal creatinine lab, consult Dr Fredirick, and Rocephin  IV given in MAU today D/C home with Rx for Duricef BID x 7 days Return precautions reviewed Keep scheduled appts with [facility]  Assessment: 1. Urinary tract infection in mother during third trimester of pregnancy   2. [redacted] weeks gestation of pregnancy   3. Acute renal insufficiency     Plan: Discharge home Labor precautions and fetal kick counts  Follow-up Information     Department, [facility] Follow up.   Contact information: [address] [place] [address] [phone]                 Allergies as of 09/01/2023       Reactions   Lactose Intolerance (gi)         Medication List  TAKE these medications    Accu-Chek Guide test strip Generic drug: glucose blood Use as instructed   Accu-Chek Guide w/Device Kit 1 Device by Does not apply route as directed.   Accu-Chek Softclix Lancets lancets Use as instructed   aspirin  EC 81 MG tablet Take 1 tablet (81 mg total) by mouth daily. Take after 12 weeks for prevention of preeclampssia later in pregnancy   cefadroxil  500 MG capsule Commonly known as: DURICEF Take 1 capsule (500 mg total) by mouth 2 (two) times daily for 7 days.   famotidine  20 MG tablet Commonly known as: Pepcid  Take 1 tablet (20 mg total) by mouth 2 (two) times daily.   ondansetron  4 MG tablet Commonly known as: Zofran  Take 1-2 tablets (4-8 mg total) by mouth every 8 (eight) hours as needed for nausea or vomiting.   polyethylene glycol powder 17 GM/SCOOP powder Commonly known as: GLYCOLAX /MIRALAX  Take 1 Container by mouth once.   prenatal multivitamin Tabs tablet Take 1 tablet by mouth daily at 12 noon.        Olam Boards Certified Nurse-Midwife 09/02/2023 12:34 PM

## 2023-09-02 ENCOUNTER — Ambulatory Visit: Payer: Medicaid Other | Admitting: *Deleted

## 2023-09-02 ENCOUNTER — Other Ambulatory Visit: Payer: Self-pay

## 2023-09-02 ENCOUNTER — Ambulatory Visit: Payer: Medicaid Other | Attending: Obstetrics and Gynecology

## 2023-09-02 VITALS — BP 119/74 | HR 79

## 2023-09-02 DIAGNOSIS — N29 Other disorders of kidney and ureter in diseases classified elsewhere: Secondary | ICD-10-CM

## 2023-09-02 DIAGNOSIS — O34219 Maternal care for unspecified type scar from previous cesarean delivery: Secondary | ICD-10-CM

## 2023-09-02 DIAGNOSIS — O09213 Supervision of pregnancy with history of pre-term labor, third trimester: Secondary | ICD-10-CM

## 2023-09-02 DIAGNOSIS — O26833 Pregnancy related renal disease, third trimester: Secondary | ICD-10-CM

## 2023-09-02 DIAGNOSIS — O24410 Gestational diabetes mellitus in pregnancy, diet controlled: Secondary | ICD-10-CM | POA: Insufficient documentation

## 2023-09-02 DIAGNOSIS — O0990 Supervision of high risk pregnancy, unspecified, unspecified trimester: Secondary | ICD-10-CM

## 2023-09-02 DIAGNOSIS — Z3A37 37 weeks gestation of pregnancy: Secondary | ICD-10-CM

## 2023-09-02 DIAGNOSIS — O09523 Supervision of elderly multigravida, third trimester: Secondary | ICD-10-CM

## 2023-09-02 DIAGNOSIS — E669 Obesity, unspecified: Secondary | ICD-10-CM

## 2023-09-02 DIAGNOSIS — O99213 Obesity complicating pregnancy, third trimester: Secondary | ICD-10-CM

## 2023-09-06 ENCOUNTER — Other Ambulatory Visit: Payer: Self-pay

## 2023-09-06 ENCOUNTER — Ambulatory Visit (INDEPENDENT_AMBULATORY_CARE_PROVIDER_SITE_OTHER): Payer: Self-pay | Admitting: Obstetrics and Gynecology

## 2023-09-06 VITALS — BP 122/82 | HR 93 | Wt 166.2 lb

## 2023-09-06 DIAGNOSIS — O34219 Maternal care for unspecified type scar from previous cesarean delivery: Secondary | ICD-10-CM

## 2023-09-06 DIAGNOSIS — Z603 Acculturation difficulty: Secondary | ICD-10-CM

## 2023-09-06 DIAGNOSIS — N289 Disorder of kidney and ureter, unspecified: Secondary | ICD-10-CM

## 2023-09-06 DIAGNOSIS — O24410 Gestational diabetes mellitus in pregnancy, diet controlled: Secondary | ICD-10-CM

## 2023-09-06 DIAGNOSIS — Z98891 History of uterine scar from previous surgery: Secondary | ICD-10-CM

## 2023-09-06 DIAGNOSIS — O2343 Unspecified infection of urinary tract in pregnancy, third trimester: Secondary | ICD-10-CM

## 2023-09-06 DIAGNOSIS — O0990 Supervision of high risk pregnancy, unspecified, unspecified trimester: Secondary | ICD-10-CM

## 2023-09-06 DIAGNOSIS — Z3A38 38 weeks gestation of pregnancy: Secondary | ICD-10-CM

## 2023-09-06 DIAGNOSIS — Z758 Other problems related to medical facilities and other health care: Secondary | ICD-10-CM

## 2023-09-06 NOTE — Progress Notes (Signed)
   PRENATAL VISIT NOTE  Subjective:  Jacqueline Watkins is a 38 y.o. [phone] at [redacted]w[redacted]d being seen today for ongoing prenatal care.  She is currently monitored for the following issues for this high-risk pregnancy and has Adenomatous polyp of ascending colon; Abnormal colonoscopy; Abnormal CT scan, colon; Supervision of high risk pregnancy, antepartum; History of C-section; History of VBAC x2; GDM (gestational diabetes mellitus); AMA (advanced maternal age) multigravida 35+; Obesity affecting pregnancy, antepartum; H. pylori infection; Language barrier; and GBS (group B Streptococcus carrier), +RV culture, currently pregnant on their problem list.  Patient reports  hip pain, occasionally especially with walking  .  Contractions: Irritability. Vag. Bleeding: None.  Movement: Present. Denies leaking of fluid.   The following portions of the patient's history were reviewed and updated as appropriate: allergies, current medications, past family history, past medical history, past social history, past surgical history and problem list.   Objective:   Vitals:   09/06/23 0959  BP: 122/82  Pulse: 93  Weight: 166 lb 3.2 oz (75.4 kg)    Fetal Status: Fetal Heart Rate (bpm): 133   Movement: Present  Presentation: Vertex (u/s 1/6)  General:  Alert, oriented and cooperative. Patient is in no acute distress.  Skin: Skin is warm and dry. No rash noted.   Cardiovascular: Normal heart rate noted  Respiratory: Normal respiratory effort, no problems with respiration noted  Abdomen: Soft, gravid, appropriate for gestational age.  Pain/Pressure: Present     Pelvic: Cervical exam deferred        Extremities: Normal range of motion.  Edema: None  Mental Status: Normal mood and affect. Normal behavior. Normal judgment and thought content.   Assessment and Plan:  Pregnancy: [id number] at [redacted]w[redacted]d 1. Supervision of high risk pregnancy, antepartum (Primary) BP and FHR normal Doing well, feeling regular movement     2. Urinary tract infection in mother during third trimester of pregnancy 3. Acute renal insufficiency Tx recently, then MAU 1/5 with return of symptoms s/p rocephin  IV and duricef x 7 days Check creatinine next visit  Follow up precautions discussed   4. Diet controlled gestational diabetes mellitus (GDM) in third trimester Forgot to bring log today, recalls fasting 87-90 and pp highest 105   U/s 1/6 efw normal growth and fluid cephalic  5. History of VBAC x2 [place] previously signed  6. [redacted] weeks gestation of pregnancy IOL 1/23 Supportive measures discussed regarding hip pain Labor precautions   7. Language barrier Ipad interpreter used    Term labor symptoms and general obstetric precautions including but not limited to vaginal bleeding, contractions, leaking of fluid and fetal movement were reviewed in detail with the patient. Please refer to After Visit Summary for other counseling recommendations.   Return in one week for routine prenatal   Future Appointments  Date Time Provider Department Center  09/13/2023  9:35 AM Ilean Norleen GAILS, MD [facility]  09/19/2023 12:00 AM [facility] SCHED ROOM [facility] None  09/20/2023 10:55 AM Eveline Lynwood MATSU, MD [facility]    [name], FNP

## 2023-09-11 ENCOUNTER — Other Ambulatory Visit: Payer: Self-pay | Admitting: Advanced Practice Midwife

## 2023-09-12 ENCOUNTER — Telehealth (HOSPITAL_COMMUNITY): Payer: Self-pay | Admitting: *Deleted

## 2023-09-12 ENCOUNTER — Encounter (HOSPITAL_COMMUNITY): Payer: Self-pay | Admitting: *Deleted

## 2023-09-12 NOTE — Telephone Encounter (Signed)
[id number] interpreter number Preadmission screen

## 2023-09-13 ENCOUNTER — Ambulatory Visit (INDEPENDENT_AMBULATORY_CARE_PROVIDER_SITE_OTHER): Payer: Self-pay | Admitting: Family Medicine

## 2023-09-13 ENCOUNTER — Other Ambulatory Visit: Payer: Self-pay

## 2023-09-13 VITALS — BP 116/77 | HR 92 | Wt 167.0 lb

## 2023-09-13 DIAGNOSIS — O2343 Unspecified infection of urinary tract in pregnancy, third trimester: Secondary | ICD-10-CM

## 2023-09-13 DIAGNOSIS — Z98891 History of uterine scar from previous surgery: Secondary | ICD-10-CM

## 2023-09-13 DIAGNOSIS — O0993 Supervision of high risk pregnancy, unspecified, third trimester: Secondary | ICD-10-CM

## 2023-09-13 DIAGNOSIS — Z758 Other problems related to medical facilities and other health care: Secondary | ICD-10-CM

## 2023-09-13 DIAGNOSIS — Z603 Acculturation difficulty: Secondary | ICD-10-CM

## 2023-09-13 DIAGNOSIS — O24410 Gestational diabetes mellitus in pregnancy, diet controlled: Secondary | ICD-10-CM

## 2023-09-13 DIAGNOSIS — O0990 Supervision of high risk pregnancy, unspecified, unspecified trimester: Secondary | ICD-10-CM

## 2023-09-13 DIAGNOSIS — B951 Streptococcus, group B, as the cause of diseases classified elsewhere: Secondary | ICD-10-CM

## 2023-09-13 DIAGNOSIS — Z3A39 39 weeks gestation of pregnancy: Secondary | ICD-10-CM

## 2023-09-13 NOTE — Progress Notes (Signed)
   PRENATAL VISIT NOTE  Subjective:  Jacqueline Watkins is a 38 y.o. [phone] at [redacted]w[redacted]d being seen today for ongoing prenatal care.  She is currently monitored for the following issues for this high-risk pregnancy and has Adenomatous polyp of ascending colon; Abnormal colonoscopy; Abnormal CT scan, colon; Supervision of high risk pregnancy, antepartum; History of C-section; History of VBAC x2; GDM (gestational diabetes mellitus); AMA (advanced maternal age) multigravida 35+; Obesity affecting pregnancy, antepartum; H. pylori infection; Language barrier; and GBS (group B Streptococcus carrier), +RV culture, currently pregnant on their problem list.  Patient reports no bleeding, no cramping, no leaking, and occasional contractions.  Contractions: Not present. Vag. Bleeding: None.  Movement: Present. Denies leaking of fluid.   The following portions of the patient's history were reviewed and updated as appropriate: allergies, current medications, past family history, past medical history, past social history, past surgical history and problem list.   Objective:   Vitals:   09/13/23 0928  BP: 116/77  Pulse: 92  Weight: 167 lb (75.8 kg)    Fetal Status: Fetal Heart Rate (bpm): 160   Movement: Present     General:  Alert, oriented and cooperative. Patient is in no acute distress.  Skin: Skin is warm and dry. No rash noted.   Cardiovascular: Normal heart rate noted  Respiratory: Normal respiratory effort, no problems with respiration noted  Abdomen: Soft, gravid, appropriate for gestational age.  Pain/Pressure: Absent     Pelvic: Cervical exam performed in the presence of a chaperone      3/70/-2  Extremities: Normal range of motion.  Edema: None  Mental Status: Normal mood and affect. Normal behavior. Normal judgment and thought content.   Assessment and Plan:  Pregnancy: [id number] at [redacted]w[redacted]d 1. Supervision of high risk pregnancy, antepartum FHR and BP appropriate today  2. Diet controlled  gestational diabetes mellitus (GDM) in third trimester Blood sugars well-controlled on dietary log Scheduled for induction on 1/22 at midnight.  Discussed earlier induction with the patient but patient requesting update.  3. History of VBAC x2 Desires TOLAC with this pregnancy  4. Language barrier Spanish interpreter used  5. [redacted] weeks gestation of pregnancy (Primary)  6. Positive GBS test Will need treatment during labor  7. Urinary tract infection in mother during third trimester of pregnancy Patient reports occasional burning with urination.  Repeat culture ordered - Culture, OB Urine  Term labor symptoms and general obstetric precautions including but not limited to vaginal bleeding, contractions, leaking of fluid and fetal movement were reviewed in detail with the patient. Please refer to After Visit Summary for other counseling recommendations.   No follow-ups on file.  Future Appointments  Date Time Provider Department Center  09/19/2023 12:00 AM [facility] SCHED ROOM [facility] None  09/20/2023 10:55 AM Adam Phenix, MD [facility]    Celedonio Savage, MD\

## 2023-09-18 ENCOUNTER — Other Ambulatory Visit: Payer: Self-pay | Admitting: Advanced Practice Midwife

## 2023-09-19 ENCOUNTER — Encounter (HOSPITAL_COMMUNITY): Payer: Self-pay | Admitting: Obstetrics and Gynecology

## 2023-09-19 ENCOUNTER — Other Ambulatory Visit: Payer: Self-pay

## 2023-09-19 ENCOUNTER — Inpatient Hospital Stay (HOSPITAL_COMMUNITY): Payer: Medicaid Other

## 2023-09-19 ENCOUNTER — Inpatient Hospital Stay (HOSPITAL_COMMUNITY)
Admission: RE | Admit: 2023-09-19 | Discharge: 2023-09-21 | DRG: 807 | Disposition: A | Discharge status: Home / Self Care | Payer: Medicaid Other | Attending: Obstetrics and Gynecology | Admitting: Obstetrics and Gynecology

## 2023-09-19 DIAGNOSIS — Z8619 Personal history of other infectious and parasitic diseases: Secondary | ICD-10-CM

## 2023-09-19 DIAGNOSIS — O99214 Obesity complicating childbirth: Secondary | ICD-10-CM | POA: Diagnosis not present

## 2023-09-19 DIAGNOSIS — O99820 Streptococcus B carrier state complicating pregnancy: Secondary | ICD-10-CM | POA: Diagnosis not present

## 2023-09-19 DIAGNOSIS — O24420 Gestational diabetes mellitus in childbirth, diet controlled: Principal | ICD-10-CM | POA: Diagnosis present

## 2023-09-19 DIAGNOSIS — O99824 Streptococcus B carrier state complicating childbirth: Secondary | ICD-10-CM | POA: Diagnosis present

## 2023-09-19 DIAGNOSIS — O34219 Maternal care for unspecified type scar from previous cesarean delivery: Secondary | ICD-10-CM | POA: Diagnosis present

## 2023-09-19 DIAGNOSIS — Z8249 Family history of ischemic heart disease and other diseases of the circulatory system: Secondary | ICD-10-CM

## 2023-09-19 DIAGNOSIS — Z23 Encounter for immunization: Secondary | ICD-10-CM | POA: Diagnosis not present

## 2023-09-19 DIAGNOSIS — O24410 Gestational diabetes mellitus in pregnancy, diet controlled: Principal | ICD-10-CM | POA: Diagnosis present

## 2023-09-19 DIAGNOSIS — O480 Post-term pregnancy: Secondary | ICD-10-CM | POA: Diagnosis not present

## 2023-09-19 DIAGNOSIS — Z3A40 40 weeks gestation of pregnancy: Secondary | ICD-10-CM | POA: Diagnosis not present

## 2023-09-19 DIAGNOSIS — Z603 Acculturation difficulty: Secondary | ICD-10-CM | POA: Diagnosis present

## 2023-09-19 DIAGNOSIS — O09523 Supervision of elderly multigravida, third trimester: Secondary | ICD-10-CM | POA: Diagnosis not present

## 2023-09-19 DIAGNOSIS — Z758 Other problems related to medical facilities and other health care: Secondary | ICD-10-CM | POA: Diagnosis present

## 2023-09-19 DIAGNOSIS — Z3A36 36 weeks gestation of pregnancy: Secondary | ICD-10-CM

## 2023-09-19 DIAGNOSIS — Z833 Family history of diabetes mellitus: Secondary | ICD-10-CM

## 2023-09-19 DIAGNOSIS — O09529 Supervision of elderly multigravida, unspecified trimester: Secondary | ICD-10-CM

## 2023-09-19 DIAGNOSIS — O0990 Supervision of high risk pregnancy, unspecified, unspecified trimester: Secondary | ICD-10-CM

## 2023-09-19 DIAGNOSIS — Z98891 History of uterine scar from previous surgery: Secondary | ICD-10-CM

## 2023-09-19 LAB — CBC
HCT: 32.7 % — ABNORMAL LOW (ref 36.0–46.0)
Hemoglobin: 10.1 g/dL — ABNORMAL LOW (ref 12.0–15.0)
MCH: 24.5 pg — ABNORMAL LOW (ref 26.0–34.0)
MCHC: 30.9 g/dL (ref 30.0–36.0)
MCV: 79.2 fL — ABNORMAL LOW (ref 80.0–100.0)
Platelets: 263 10*3/uL (ref 150–400)
RBC: 4.13 MIL/uL (ref 3.87–5.11)
RDW: 17.4 % — ABNORMAL HIGH (ref 11.5–15.5)
WBC: 9.7 10*3/uL (ref 4.0–10.5)
nRBC: 0 % (ref 0.0–0.2)

## 2023-09-19 LAB — COMPREHENSIVE METABOLIC PANEL
ALT: 10 U/L (ref 0–44)
AST: 14 U/L — ABNORMAL LOW (ref 15–41)
Albumin: 2.7 g/dL — ABNORMAL LOW (ref 3.5–5.0)
Alkaline Phosphatase: 300 U/L — ABNORMAL HIGH (ref 38–126)
Anion gap: 10 (ref 5–15)
BUN: 16 mg/dL (ref 6–20)
CO2: 12 mmol/L — ABNORMAL LOW (ref 22–32)
Calcium: 8.4 mg/dL — ABNORMAL LOW (ref 8.9–10.3)
Chloride: 107 mmol/L (ref 98–111)
Creatinine, Ser: 0.89 mg/dL (ref 0.44–1.00)
GFR, Estimated: >60 mL/min (ref >60)
Glucose, Bld: 85 mg/dL (ref 70–99)
Potassium: 3.7 mmol/L (ref 3.5–5.1)
Sodium: 129 mmol/L — ABNORMAL LOW (ref 135–145)
Total Bilirubin: 0.3 mg/dL (ref 0.0–1.2)
Total Protein: 6.7 g/dL (ref 6.5–8.1)

## 2023-09-19 LAB — GLUCOSE, CAPILLARY
Glucose-Capillary: 69 mg/dL — ABNORMAL LOW (ref 70–99)
Glucose-Capillary: 76 mg/dL (ref 70–99)
Glucose-Capillary: 91 mg/dL (ref 70–99)

## 2023-09-19 LAB — TYPE AND SCREEN
ABO/RH(D): O POS
Antibody Screen: NEGATIVE

## 2023-09-19 LAB — RPR: RPR Ser Ql: NONREACTIVE

## 2023-09-19 MED ORDER — LIDOCAINE HCL (PF) 1 % IJ SOLN: 30.0000 mL | INTRAMUSCULAR | Status: DC | PRN

## 2023-09-19 MED ORDER — PRENATAL MULTIVITAMIN CH
1.0000 | ORAL_TABLET | Freq: Every day | ORAL | Status: DC
Start: 2023-09-19 — End: 2023-09-21
  Administered 2023-09-19 – 2023-09-21 (×3): 1 via ORAL
  Filled 2023-09-19 (×3): qty 1

## 2023-09-19 MED ORDER — OXYTOCIN-SODIUM CHLORIDE 30-0.9 UT/500ML-% IV SOLN
2.5000 [IU]/h | INTRAVENOUS | Status: DC
  Filled 2023-09-19: qty 500

## 2023-09-19 MED ORDER — WITCH HAZEL-GLYCERIN EX PADS: 1.0000 | MEDICATED_PAD | CUTANEOUS | Status: DC | PRN

## 2023-09-19 MED ORDER — OXYTOCIN-SODIUM CHLORIDE 30-0.9 UT/500ML-% IV SOLN
1.0000 m[IU]/min | INTRAVENOUS | Status: DC
  Administered 2023-09-19: 2 m[IU]/min via INTRAVENOUS

## 2023-09-19 MED ORDER — ONDANSETRON HCL 4 MG/2ML IJ SOLN: 4.0000 mg | INTRAMUSCULAR | Status: DC | PRN

## 2023-09-19 MED ORDER — COCONUT OIL OIL: 1.0000 | TOPICAL_OIL | Status: DC | PRN

## 2023-09-19 MED ORDER — EPHEDRINE 5 MG/ML INJ
10.0000 mg | INTRAVENOUS | Status: DC | PRN
Start: 2023-09-19 — End: 2023-09-19

## 2023-09-19 MED ORDER — SOD CITRATE-CITRIC ACID 500-334 MG/5ML PO SOLN: 30.0000 mL | ORAL | Status: DC | PRN

## 2023-09-19 MED ORDER — DIBUCAINE (PERIANAL) 1 % EX OINT: 1.0000 | TOPICAL_OINTMENT | CUTANEOUS | Status: DC | PRN

## 2023-09-19 MED ORDER — LACTATED RINGERS IV SOLN
500.0000 mL | INTRAVENOUS | Status: DC | PRN
  Administered 2023-09-19: 500 mL via INTRAVENOUS

## 2023-09-19 MED ORDER — ACETAMINOPHEN 325 MG PO TABS: 650.0000 mg | ORAL_TABLET | ORAL | Status: DC | PRN

## 2023-09-19 MED ORDER — PHENYLEPHRINE 80 MCG/ML (10ML) SYRINGE FOR IV PUSH (FOR BLOOD PRESSURE SUPPORT): 80.0000 ug | PREFILLED_SYRINGE | INTRAVENOUS | Status: DC | PRN

## 2023-09-19 MED ORDER — ONDANSETRON HCL 4 MG/2ML IJ SOLN: 4.0000 mg | Freq: Four times a day (QID) | INTRAMUSCULAR | Status: DC | PRN

## 2023-09-19 MED ORDER — TETANUS-DIPHTH-ACELL PERTUSSIS 5-2.5-18.5 LF-MCG/0.5 IM SUSY
0.5000 mL | PREFILLED_SYRINGE | Freq: Once | INTRAMUSCULAR | Status: AC
  Administered 2023-09-20: 0.5 mL via INTRAMUSCULAR
  Filled 2023-09-19: qty 0.5

## 2023-09-19 MED ORDER — EPHEDRINE 5 MG/ML INJ: 10.0000 mg | INTRAVENOUS | Status: DC | PRN

## 2023-09-19 MED ORDER — LACTATED RINGERS IV SOLN: 500.0000 mL | Freq: Once | INTRAVENOUS | Status: DC

## 2023-09-19 MED ORDER — ZOLPIDEM TARTRATE 5 MG PO TABS: 5.0000 mg | ORAL_TABLET | Freq: Every evening | ORAL | Status: DC | PRN

## 2023-09-19 MED ORDER — SODIUM CHLORIDE 0.9 % IV SOLN
5.0000 10*6.[IU] | Freq: Once | INTRAVENOUS | Status: AC
  Administered 2023-09-19: 5 10*6.[IU] via INTRAVENOUS
  Filled 2023-09-19: qty 5

## 2023-09-19 MED ORDER — ONDANSETRON HCL 4 MG PO TABS: 4.0000 mg | ORAL_TABLET | ORAL | Status: DC | PRN

## 2023-09-19 MED ORDER — CYCLOBENZAPRINE HCL 10 MG PO TABS
5.0000 mg | ORAL_TABLET | Freq: Three times a day (TID) | ORAL | Status: DC | PRN
  Administered 2023-09-19: 5 mg via ORAL
  Filled 2023-09-19: qty 1

## 2023-09-19 MED ORDER — LACTATED RINGERS IV SOLN: INTRAVENOUS | Status: DC

## 2023-09-19 MED ORDER — DIPHENHYDRAMINE HCL 50 MG/ML IJ SOLN: 12.5000 mg | INTRAMUSCULAR | Status: DC | PRN

## 2023-09-19 MED ORDER — IBUPROFEN 600 MG PO TABS
600.0000 mg | ORAL_TABLET | Freq: Four times a day (QID) | ORAL | Status: DC
  Administered 2023-09-19 – 2023-09-21 (×9): 600 mg via ORAL
  Filled 2023-09-19 (×9): qty 1

## 2023-09-19 MED ORDER — TERBUTALINE SULFATE 1 MG/ML IJ SOLN
0.2500 mg | Freq: Once | INTRAMUSCULAR | Status: DC | PRN
  Filled 2023-09-19: qty 1

## 2023-09-19 MED ORDER — FENTANYL-BUPIVACAINE-NACL 0.5-0.125-0.9 MG/250ML-% EP SOLN
12.0000 mL/h | EPIDURAL | Status: DC | PRN
  Filled 2023-09-19: qty 250

## 2023-09-19 MED ORDER — DIPHENHYDRAMINE HCL 25 MG PO CAPS: 25.0000 mg | ORAL_CAPSULE | Freq: Four times a day (QID) | ORAL | Status: DC | PRN

## 2023-09-19 MED ORDER — OXYTOCIN BOLUS FROM INFUSION
333.0000 mL | Freq: Once | INTRAVENOUS | Status: AC
  Administered 2023-09-19: 333 mL via INTRAVENOUS

## 2023-09-19 MED ORDER — PENICILLIN G POT IN DEXTROSE 60000 UNIT/ML IV SOLN
3.0000 10*6.[IU] | INTRAVENOUS | Status: DC
  Administered 2023-09-19: 3 10*6.[IU] via INTRAVENOUS
  Filled 2023-09-19: qty 50

## 2023-09-19 MED ORDER — SIMETHICONE 80 MG PO CHEW: 80.0000 mg | CHEWABLE_TABLET | ORAL | Status: DC | PRN

## 2023-09-19 MED ORDER — SENNOSIDES-DOCUSATE SODIUM 8.6-50 MG PO TABS
2.0000 | ORAL_TABLET | Freq: Every day | ORAL | Status: DC
  Administered 2023-09-20 – 2023-09-21 (×2): 2 via ORAL
  Filled 2023-09-19 (×2): qty 2

## 2023-09-19 MED ORDER — TERBUTALINE SULFATE 1 MG/ML IJ SOLN: 0.2500 mg | Freq: Once | INTRAMUSCULAR | Status: DC | PRN

## 2023-09-19 MED ORDER — OXYCODONE HCL 5 MG PO TABS
5.0000 mg | ORAL_TABLET | Freq: Four times a day (QID) | ORAL | Status: DC | PRN
  Administered 2023-09-19: 5 mg via ORAL
  Filled 2023-09-19: qty 1

## 2023-09-19 MED ORDER — FENTANYL CITRATE (PF) 100 MCG/2ML IJ SOLN
50.0000 ug | INTRAMUSCULAR | Status: DC | PRN
  Administered 2023-09-19: 50 ug via INTRAVENOUS
  Filled 2023-09-19: qty 2

## 2023-09-19 MED ORDER — PHENYLEPHRINE 80 MCG/ML (10ML) SYRINGE FOR IV PUSH (FOR BLOOD PRESSURE SUPPORT)
80.0000 ug | PREFILLED_SYRINGE | INTRAVENOUS | Status: DC | PRN
  Filled 2023-09-19: qty 10

## 2023-09-19 MED ORDER — BENZOCAINE-MENTHOL 20-0.5 % EX AERO: 1.0000 | INHALATION_SPRAY | CUTANEOUS | Status: DC | PRN

## 2023-09-19 NOTE — Lactation Note (Signed)
This note was copied from a baby's chart. Lactation Consultation Note  Patient Name: Jacqueline Watkins [id number] Date: 09/19/2023 Age:38 hours   P4- LC attempted to see MOB, but she was asleep at this time. Lactation team will attempt to see her at another time.   Dema Severin BS, IBCLC 09/19/2023, 9:23 PM

## 2023-09-19 NOTE — Discharge Summary (Signed)
Postpartum Discharge Summary     Patient Name: Jacqueline Watkins DOB: Jan 20, 1986 MRN: [id number]  Date of admission: 09/19/2023 Delivery date:09/19/2023 Delivering provider: Fayne Mediate D Date of discharge: 09/21/2023  Admitting diagnosis: GDM, class A1 [O24.410] Intrauterine pregnancy: [redacted]w[redacted]d     Secondary diagnosis:  Principal Problem:   GDM, class A1 Active Problems:   History of C-section   History of VBAC x2   AMA (advanced maternal age) multigravida 35+   Language barrier   GBS (group B Streptococcus carrier), +RV culture, currently pregnant  Additional problems: none    Discharge diagnosis: Term Pregnancy Delivered, VBAC, and GDM A1                                              Post partum procedures: none Augmentation: AROM and Pitocin Complications: None  Hospital course: Induction of Labor With Vaginal Delivery   38 y.o. yo [id number] at [redacted]w[redacted]d was admitted to the hospital 09/19/2023 for induction of labor.  Indication for induction: A1 DM.  Patient had an uncomplicated labor course.  Membrane Rupture Time/Date: 6:46 AM,09/19/2023  Delivery Method:VBAC, Spontaneous Operative Delivery:N/A Episiotomy: None Lacerations:  None Details of delivery can be found in separate delivery note.  Patient had a postpartum course complicated by none. Patient is discharged home 09/21/23.  Newborn Data: Birth date:09/19/2023 Birth time:7:48 AM Gender:Female Living status:Living Apgars:8 ,9  Weight:3030 g  Magnesium Sulfate received: No BMZ received: No Rhophylac:N/A MMR:N/A T-DaP: declined prenatally Flu: No RSV Vaccine received: No Transfusion:No  Immunizations received: Immunization History  Administered Date(s) Administered   Tdap 06/02/2011, 09/20/2023    Physical exam  Vitals:   09/20/23 0538 09/20/23 1756 09/20/23 2049 09/21/23 0641  BP: 95/60 108/63 112/73 (!) 98/56  Pulse: 80 99 99 73  Resp: 18 16 20 20   Temp: 98 F (36.7 C) 97.8 F (36.6 C) 97.6 F  (36.4 C) 98.1 F (36.7 C)  TempSrc: Oral Oral Oral Oral  SpO2: 100% 100% 100% 100%  Weight:      Height:       General: alert, cooperative, and no distress Lochia: appropriate Uterine Fundus: firm Incision: N/A DVT Evaluation: No evidence of DVT seen on physical exam. Labs: Lab Results  Component Value Date   WBC 9.7 09/19/2023   HGB 10.1 (L) 09/19/2023   HCT 32.7 (L) 09/19/2023   MCV 79.2 (L) 09/19/2023   PLT 263 09/19/2023      Latest Ref Rng & Units 09/19/2023   12:13 AM  CMP  Glucose 70 - 99 mg/dL 85   BUN 6 - 20 mg/dL 16   Creatinine 1.47 - 1.00 mg/dL 8.29   Sodium 562 - 130 mmol/L 129   Potassium 3.5 - 5.1 mmol/L 3.7   Chloride 98 - 111 mmol/L 107   CO2 22 - 32 mmol/L 12   Calcium 8.9 - 10.3 mg/dL 8.4   Total Protein 6.5 - 8.1 g/dL 6.7   Total Bilirubin 0.0 - 1.2 mg/dL 0.3   Alkaline Phos 38 - 126 U/L 300   AST 15 - 41 U/L 14   ALT 0 - 44 U/L 10    Edinburgh Score:    09/21/2023    9:45 AM  Edinburgh Postnatal Depression Scale Screening Tool  I have been able to laugh and see the funny side of things. 0  I have looked forward with  enjoyment to things. 1  I have blamed myself unnecessarily when things went wrong. 0  I have been anxious or worried for no good reason. 2  I have felt scared or panicky for no good reason. 0  Things have been getting on top of me. 1  I have been so unhappy that I have had difficulty sleeping. 2  I have felt sad or miserable. 1  I have been so unhappy that I have been crying. 1  The thought of harming myself has occurred to me. 0  Edinburgh Postnatal Depression Scale Total 8   Edinburgh Postnatal Depression Scale Total: 8   After visit meds:  Allergies as of 09/21/2023       Reactions   Lactose Intolerance (gi)         Medication List     STOP taking these medications    Accu-Chek Guide test strip Generic drug: glucose blood   Accu-Chek Guide w/Device Kit   Accu-Chek Softclix Lancets lancets   aspirin EC 81  MG tablet   ondansetron 4 MG tablet Commonly known as: Zofran       TAKE these medications    acetaminophen 325 MG tablet Commonly known as: Tylenol Take 2 tablets (650 mg total) by mouth every 4 (four) hours as needed (for pain scale < 4).   famotidine 20 MG tablet Commonly known as: Pepcid Take 1 tablet (20 mg total) by mouth 2 (two) times daily.   ibuprofen 600 MG tablet Commonly known as: ADVIL Take 1 tablet (600 mg total) by mouth every 6 (six) hours.   polyethylene glycol powder 17 GM/SCOOP powder Commonly known as: GLYCOLAX/MIRALAX Take 1 Container by mouth once.   prenatal multivitamin Tabs tablet Take 1 tablet by mouth daily at 12 noon.       Discharge home in stable condition Infant Feeding: Bottle and Breast Infant Disposition:home with mother Discharge instruction: per After Visit Summary and Postpartum booklet. Activity: Advance as tolerated. Pelvic rest for 6 weeks.  Diet: routine diet Future Appointments:No future appointments. Follow up Visit:  Arabella Merles, CNM  P [facility] Admin Pool Please schedule this patient for Postpartum visit in: 6 weeks with the following provider: Any provider In-Person For C/S patients schedule nurse incision check in weeks 2 weeks: no High risk pregnancy complicated by: A1GDM Delivery mode:  SVD Anticipated Birth Control:  Nexplanon @ HD PP Procedures needed: 2 hour GTT @ PP visit; GI referral; ?ortho referral R hip pain Schedule Integrated BH visit: no   09/21/2023 Raelyn Mora, CNM

## 2023-09-19 NOTE — H&P (Addendum)
OBSTETRIC ADMISSION HISTORY AND PHYSICAL  Jacqueline Watkins is a 38 y.o. female [phone] with IUP at [redacted]w[redacted]d by LMP presenting for IOL for A1GDM. She reports +FMs, No LOF, no VB, no blurry vision, headaches or peripheral edema, and RUQ pain.  She plans on breast feeding. She request Nexplanon for birth control. She received her prenatal care at  [facility]    Dating: By LMP --->  Estimated Date of Delivery: 09/19/23  Sono:    @[redacted]w[redacted]d , CWD, normal anatomy, cephalic presentation, anterior placenta, 3170g, 52% EFW   Prenatal History/Complications:    NURSING  PROVIDER  [facility] Dating by LMP; EDD 09/19/23  [facility] Model Traditional Anatomy U/S Normal, f/w MFM  Initiated care at  [employer]  Spanish              LAB RESULTS   Support Person FOB Genetics NIPS: no records of it being done AFP:     NT/IT (FT only)     Carrier Screen Horizon:   Rhogam  O/Positive/-- (07/15 0000) A1C/GTT Early: 1 hr GTT--169 Third trimester: Failed      -FBS: 72     -1 hr: 214     -2 hr: 224     -3 hr: 151  Flu Vaccine     TDaP Vaccine   Blood Type O/Positive/-- (07/15 0000)  Covid Vaccine  Antibody Negative (07/15 0000)  RSV vaccine  Rubella Immune (07/15 0000)  Feeding Plan both RPR Nonreactive (07/15 0000)  Contraception Undecided , husband counseled on vasectomy by Crissie Reese, she is considering Nexplanon HBsAg Negative (07/15 0000)  Circumcision No HIV Non-reactive (07/15 0000)  Pediatrician  LIST GIVEN HCVAb Negative (07/15 0000)  Prenatal Classes       Pap   BTLConsent NA GC/CT Initial:  Negative/Negative  36wks:    VBAC  Consent TOLAC 05/15/23 GBS   For PCN allergy, check sensitivities        DME Rx [X]  BP cuff [ ]  Weight Scale Waterbirth  [ ]  Class [ ]  Consent [ ]  CNM visit  PHQ9 & GAD7 [  ] new OB [  ] 28 weeks  [  ] 36 weeks Induction  [ ]  Orders Entered [ ] Foley Y/N     Past Medical History: Past Medical History:  Diagnosis Date    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   Constipation 02/02/2021   History of sexually transmitted disease 01/11/2015   Gonorrhea and chlamydia 4-5 years ago     Iron deficiency anemia 12/24/2018   Kidney stones 03/08/2015   Microcytic anemia 09/11/2018   Neutrophilia 12/06/2018   Removal Reason: Historical     Pyelonephritis    Sepsis due to urinary tract infection (HCC) 09/11/2018   Steatosis of liver 01/11/2015   Umbilical hernia 12/06/2018   Fat-containing per CT at [facility] ED      Past Surgical History: Past Surgical History:  Procedure Laterality Date   BIOPSY  04/10/2021   Procedure: BIOPSY;  Surgeon: Lemar Lofty., MD;  Location: [facility];  Service: Gastroenterology;;   CESAREAN SECTION  2005   COLONOSCOPY     COLONOSCOPY WITH PROPOFOL N/A 04/10/2021   Procedure: COLONOSCOPY WITH PROPOFOL;  Surgeon: Lemar Lofty., MD;  Location: [facility];  Service: Gastroenterology;  Laterality: N/A;   ENDOSCOPIC MUCOSAL RESECTION N/A 04/10/2021   Procedure: ENDOSCOPIC MUCOSAL RESECTION;  Surgeon: Meridee Score Netty Starring., MD;  Location:  [facility];  Service: Gastroenterology;  Laterality: N/A;   ESOPHAGOGASTRODUODENOSCOPY (EGD) WITH PROPOFOL N/A 04/10/2021   Procedure: ESOPHAGOGASTRODUODENOSCOPY (EGD) WITH PROPOFOL;  Surgeon: Meridee Score Netty Starring., MD;  Location: [facility];  Service: Gastroenterology;  Laterality: N/A;   HEMOSTASIS CLIP PLACEMENT  04/10/2021   Procedure: HEMOSTASIS CLIP PLACEMENT;  Surgeon: Lemar Lofty., MD;  Location: [facility];  Service: Gastroenterology;;   HOT HEMOSTASIS N/A 04/10/2021   Procedure: HOT HEMOSTASIS (ARGON PLASMA COAGULATION/BICAP);  Surgeon: Lemar Lofty., MD;  Location: [facility];  Service: Gastroenterology;  Laterality: N/A;   POLYPECTOMY  04/10/2021   Procedure: POLYPECTOMY;  Surgeon: Mansouraty, Netty Starring., MD;  Location: [facility];  Service: Gastroenterology;;    Obstetrical History: OB  History     Gravida  5   Para  3   Term  2   Preterm  1   AB  1   Living  3      SAB  1   IAB  0   Ectopic  0   Multiple  0   Live Births  3           Social History Social History   Socioeconomic History   Marital status: Married    Spouse name: Not on file   Number of children: 3   Years of education: Not on file   Highest education level: Not on file  Occupational History   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  Tobacco Use   Smoking status: Never   Smokeless tobacco: Never  Vaping Use   Vaping status: Never Used  Substance and Sexual Activity   Alcohol use: No   Drug use: No   Sexual activity: Yes    Birth control/protection: None  Other Topics Concern   Not on file  Social History Narrative   Not on file   Social Drivers of Health   Financial Resource Strain: Not on file  Food Insecurity: No Food Insecurity (08/31/2023)   Hunger Vital Sign    Worried About Running Out of Food in the Last Year: Never true    Ran Out of Food in the Last Year: Never true  Transportation Needs: No Transportation Needs (08/31/2023)   PRAPARE - Administrator, Civil Service (Medical): No    Lack of Transportation (Non-Medical): No  Physical Activity: Not on file  Stress: Not on file  Social Connections: Not on file    Family History: Family History  Problem Relation Age of Onset   Diabetes Mother    Heart disease Mother    Hypertension Mother    Colon cancer Neg Hx    Esophageal cancer Neg Hx    Inflammatory bowel disease Neg Hx    Liver disease Neg Hx    Pancreatic cancer Neg Hx    Rectal cancer Neg Hx    Stomach cancer Neg Hx     Allergies: Allergies  Allergen Reactions   Lactose Intolerance (Gi)     Medications Prior to Admission  Medication Sig Dispense Refill Last Dose/Taking   Accu-Chek Softclix Lancets lancets Use as instructed 100 each 12    aspirin EC 81 MG tablet Take 1 tablet (81 mg total) by mouth daily.  Take after 12 weeks for prevention of preeclampssia later in pregnancy 300 tablet 2    Blood Glucose Monitoring Suppl (ACCU-CHEK GUIDE) w/Device KIT 1 Device by Does not apply route as directed. 1 kit 0    famotidine (PEPCID) 20 MG tablet Take  1 tablet (20 mg total) by mouth 2 (two) times daily. 60 tablet 3    glucose blood (ACCU-CHEK GUIDE) test strip Use as instructed 100 each 12    ondansetron (ZOFRAN) 4 MG tablet Take 1-2 tablets (4-8 mg total) by mouth every 8 (eight) hours as needed for nausea or vomiting. (Patient not taking: Reported on 08/26/2023) 30 tablet 3    polyethylene glycol powder (GLYCOLAX/MIRALAX) 17 GM/SCOOP powder Take 1 Container by mouth once.      Prenatal Vit-Fe Fumarate-FA (PRENATAL MULTIVITAMIN) TABS tablet Take 1 tablet by mouth daily at 12 noon.        Review of Systems   All systems reviewed and negative except as stated in HPI  Last menstrual period 12/13/2022. General appearance: alert and cooperative Lungs: clear to auscultation bilaterally Heart: regular rate and rhythm Abdomen: soft, non-tender; bowel sounds normal Extremities: Homans sign is negative, no sign of DVT Presentation: cephalic Fetal monitoringBaseline: 120bpm bpm, Variability: Good {> 6 bpm), Accelerations: Reactive, and Decelerations: Absent Uterine activityFrequency: Every 3-5 minutes    Prenatal labs: ABO, Rh: O/Positive/-- (07/15 0000) Antibody: Negative (07/15 0000) Rubella: Immune (07/15 0000) RPR: Non Reactive (10/31 0854)  HBsAg: Negative (07/15 0000)  HIV: Non Reactive (10/31 0854)  GBS: Positive/-- (12/30 1626)    Lab Results  Component Value Date   GBS Positive (A) 08/26/2023   GTT: positive Genetic screening: not done Anatomy US: all structures visualized appear normal  Immunization History  Administered Date(s) Administered   Tdap 06/02/2011    Prenatal Transfer Tool  Maternal Diabetes: Yes:  Diabetes Type:  Diet controlled Genetic Screening:  Declined Maternal Ultrasounds/Referrals: Normal Fetal Ultrasounds or other Referrals:  None Maternal Substance Abuse:  No Significant Maternal Medications:  None Significant Maternal Lab Results: Group B Strep positive Number of Prenatal Visits:greater than 3 verified prenatal visits Maternal Vaccinations:did not receive Other Comments:  None   Results for orders placed or performed during the hospital encounter of 09/19/23 (from the past 24 hours)  Glucose, capillary   Collection Time: 09/19/23 12:37 AM  Result Value Ref Range   Glucose-Capillary 91 70 - 99 mg/dL   Comment 1 Notify RN    Comment 2 Document in Chart     Patient Active Problem List   Diagnosis Date Noted   GDM, class A1 09/19/2023   GBS (group B Streptococcus carrier), +RV culture, currently pregnant 08/30/2023   Language barrier 08/26/2023   H. pylori infection 06/11/2023   AMA (advanced maternal age) multigravida 35+ 04/18/2023   Obesity affecting pregnancy, antepartum 04/18/2023   Supervision of high risk pregnancy, antepartum 03/18/2023   GDM (gestational diabetes mellitus) 03/18/2023   History of VBAC x2    Adenomatous polyp of ascending colon 03/02/2021   Abnormal colonoscopy 03/02/2021   Abnormal CT scan, colon 02/02/2021   History of C-section 2005    Assessment/Plan:  Jacqueline Watkins is a 38 y.o. [id number] at [redacted]w[redacted]d here for IOL for A1GDM.   #Labor: Plan for pitocin per protocol.  #Pain: Un-medicated #FWB: Cat 1 #GBS status:  Positive #Feeding: Breastmilk  #Reproductive Life planning: Nexplanon #Circ:  no  Margie Billet, MD  09/19/2023, 12:44 AM   CNM attestation:  I have seen and examined this patient; I agree with above documentation in the resident's note.   Jacqueline Watkins is a 38 y.o. [phone] here for IOL due to A1GDM (EFW 52%); hx VBAC x 2  PE: BP 123/78   Pulse 72   Temp 98.1 F (36.7 C) (  Oral)   Resp 16   Ht 5\' 1"  (1.549 m)   Wt 76.8 kg   LMP 12/13/2022   BMI  32.01 kg/m  Gen: feeling a little discomfort w ctx Resp: normal effort, no distress Abd: gravid  ROS, labs, PMH reviewed  Plan: Admit to Labor and Delivery Plan PCN for GBS ppx, then start Pitocin as IOL method Plan for AROM later Anticipate successful VBAC  Arabella Merles CNM 09/19/2023, 2:59 AM

## 2023-09-19 NOTE — Progress Notes (Signed)
Hypoglycemic Event  CBG: 69   Treatment: 8 oz juice/soda  Symptoms: None  Follow-up CBG: Time:0447 CBG Result:76  Possible Reasons for Event: Inadequate meal intake and Other: labor  Comments/MD notified:Kim Clelia Croft CNM notified      [place] T Tierre Gerard

## 2023-09-19 NOTE — Progress Notes (Signed)
Patient ID: Jacqueline Watkins, female   DOB: Jun 21, 1986, 38 y.o.   MRN: [id number] Labor Progress Note Jacqueline Watkins is a 38 y.o. [id number] at [redacted]w[redacted]d presented for IOL for A1GDM S: She is laying comfortably in bed. Reports that she is feeling contractions, they are mild.   O:  BP 123/78   Pulse 72   Temp 98.1 F (36.7 C) (Oral)   Resp 16   Ht 5\' 1"  (1.549 m)   Wt 76.8 kg   LMP 12/13/2022   BMI 32.01 kg/m  EFM: 120/+accels/no decels  CVE: Dilation: 3.5 Effacement (%): 50 Station: -1 Presentation: Vertex Exam by:: [place] Turner RN   A&P: 38 y.o. [id number] [redacted]w[redacted]d for IOL.  #Labor: Progressing well. Plan to start pitocin per protocol #Pain: un-medicated #FWB: Cat 1 #GBS positive  #A1GDM: q4h POCT CBG  Margie Billet, MD 2:40 AM

## 2023-09-19 NOTE — Progress Notes (Signed)
Patient ID: Jacqueline Watkins, female   DOB: 1985/10/19, 38 y.o.   MRN: [id number]  Feeling ctx as stronger now, but R hip has become more painful; this pain started in Nov and has been increasing and even today is hurting more  VSS, afebrile FHR 130s, +accels, occ mi variables Ctx irreg 2-4 mins with Pit at 59mu/min Cx 3/60/vtx -2; AROM for light MSF  CBGs: 76, 69, 91  IUP@40 .0wks IOL process A1GDM TOLAC  -Hopeful that with AROM and Pit she will get into active labor -Will try Flexeril for hip pain -Anticipate VBAC  Arabella Merles CNM 09/19/2023 7:00 AM

## 2023-09-20 ENCOUNTER — Encounter: Payer: Self-pay | Admitting: Obstetrics & Gynecology

## 2023-09-20 NOTE — Progress Notes (Signed)
Ordered lunch and snack, by Orlan Leavens Spanish Medical Interpreter.

## 2023-09-20 NOTE — Lactation Note (Signed)
This note was copied from a baby's chart. Lactation Consultation Note  Patient Name: Boy Arynn Armand [id number] Date: 09/20/2023 Age:38 hours  Reason for consult: Follow-up assessment;Term  Mother requested family member to interpret. Mother reports baby is breastfeeding a little better now. She said her nurse helped her and baby just fed for 20 minutes. Mother declined assistance and was getting a bottle ready to feed baby.   Visitors present and mother eating dinner.    Feeding Mother's Current Feeding Choice: Breast Milk and Formula Nipple Type: Nfant Standard Flow (white)  Interventions Interventions: Education  Consult Status Consult Status: Follow-up Date: 09/21/23 Follow-up type: In-patient    Christella Hartigan M 09/20/2023, 7:21 PM

## 2023-09-20 NOTE — Progress Notes (Addendum)
POSTPARTUM PROGRESS NOTE  Post Partum Day 1  Subjective:  Jacqueline Watkins is a 38 y.o. [id number] s/p SVD at [redacted]w[redacted]d.  She reports she is doing well. No acute events overnight. She denies any problems with ambulating, voiding or po intake. Denies nausea or vomiting.  Pain is well controlled.  Lochia is appropriate. Motivated to go home as soon as possible.  Objective: Blood pressure 95/60, pulse 80, temperature 98 F (36.7 C), temperature source Oral, resp. rate 18, height 5\' 1"  (1.549 m), weight 76.8 kg, last menstrual period 12/13/2022, SpO2 100%, unknown if currently breastfeeding.  Physical Exam:  General: alert, cooperative and no distress Chest: no respiratory distress Heart:regular rate, distal pulses intact Uterine Fundus: firm, appropriately tender DVT Evaluation: No calf swelling or tenderness Extremities: trace edema Skin: warm, dry  Recent Labs    09/19/23 0013  HGB 10.1*  HCT 32.7*    Assessment/Plan: Jacqueline Watkins is a 38 y.o. [id number] s/p SVD at [redacted]w[redacted]d   PPD#1 - Doing well  Routine postpartum care  Contraception: Nexplanon at [facility] Feeding: Breast and bottle Dispo: Plan for discharge later today or tomorrow if baby not discharged.  [employer] Spanish interpreter Fanc[place] [phone] used for encounter   LOS: 1 day   Wyn Forster, MD OB Fellow  09/20/2023, 11:41 AM

## 2023-09-21 MED ORDER — ACETAMINOPHEN 325 MG PO TABS: 650.0000 mg | ORAL_TABLET | ORAL | 1 refills | Status: DC | PRN

## 2023-09-21 MED ORDER — IBUPROFEN 600 MG PO TABS: 600.0000 mg | ORAL_TABLET | Freq: Four times a day (QID) | ORAL | 0 refills | Status: DC

## 2023-09-21 NOTE — Lactation Note (Signed)
This note was copied from a baby's chart. Lactation Consultation Note  Patient Name: Jacqueline Watkins [id number] Date: 09/21/2023 Age:38 hours Reason for consult: Follow-up assessment;Maternal discharge;Term  Interpreter services used for this consult: Cherlyn Labella #[id number]  [facility] consulted with mom of 35 hour old infant for breastfeeding education and support for discharge. Infant currently asleep in mother's arms. Mom reports having difficulty with getting infant to sustain at the breast including short feedings at the breast when offered. LC reviewed how to support baby at the breast with feeding and advised to ensure that the breasts are stimulated when infant is not offered the breasts to maintain and encourage milk production. Mom was given a manual pump for home to use for breast stimulation. LC reviewed milk storage guidelines and breast pump cleaning. Recommended that mom calls for support if needing any assistance prior to discharge.    Maternal Data    Feeding Mother's Current Feeding Choice: Breast Milk and Formula  LATCH Score   Infant asleep at time of LC entry.   Lactation Tools Discussed/Used    Interventions Interventions: Breast feeding basics reviewed;Education;Hand pump;CDC Guidelines for Breast Pump Cleaning  Discharge Discharge Education: Engorgement and breast care;Outpatient recommendation Pump: Manual (does not have a personal electric pump at home) [facility] Program: Yes  Consult Status Consult Status: Complete Date: 09/21/23    Su Grand 09/21/2023, 9:16 AM

## 2023-09-28 ENCOUNTER — Telehealth (HOSPITAL_COMMUNITY): Payer: Self-pay | Admitting: *Deleted

## 2023-09-28 NOTE — Telephone Encounter (Signed)
Attempted hospital discharge follow-up call with Language Line interpreter. No answer received. Deforest Hoyles, RN, 09/28/23, [phone]

## 2023-11-06 ENCOUNTER — Ambulatory Visit: Payer: Self-pay | Admitting: Certified Nurse Midwife

## 2023-11-11 ENCOUNTER — Ambulatory Visit: Payer: Self-pay | Admitting: Physician Assistant

## 2023-11-11 ENCOUNTER — Other Ambulatory Visit: Payer: Self-pay

## 2023-11-11 ENCOUNTER — Encounter: Payer: Self-pay | Admitting: Physician Assistant

## 2023-11-11 VITALS — BP 105/80 | HR 81 | Ht 61.0 in | Wt 160.6 lb

## 2023-11-11 DIAGNOSIS — O24419 Gestational diabetes mellitus in pregnancy, unspecified control: Secondary | ICD-10-CM

## 2023-11-11 NOTE — Progress Notes (Signed)
 Post Partum Visit Note  Jacqueline Watkins is a 38 y.o. [phone] female who presents for a postpartum visit. She is 7 weeks postpartum following a normal spontaneous vaginal delivery.  I have fully reviewed the prenatal and intrapartum course. The delivery was at 40/0 gestational weeks.  Anesthesia: none. Postpartum course has been doing well. Baby is doing well. Baby is feeding by bottle - Similac Advance. Bleeding no bleeding. Bowel function is normal. Bladder function is normal. Patient is sexually active. Contraception method is condoms. Postpartum depression screening: negative.   The pregnancy intention screening data noted above was reviewed. Potential methods of contraception were discussed. The patient elected to proceed with No data recorded.   Edinburgh Postnatal Depression Scale - 11/11/23 1334       Edinburgh Postnatal Depression Scale:  In the Past 7 Days   I have been able to laugh and see the funny side of things. 0    I have looked forward with enjoyment to things. 0    I have blamed myself unnecessarily when things went wrong. 0    I have been anxious or worried for no good reason. 0    I have felt scared or panicky for no good reason. 0    Things have been getting on top of me. 0    I have been so unhappy that I have had difficulty sleeping. 1    I have felt sad or miserable. 0    I have been so unhappy that I have been crying. 0    The thought of harming myself has occurred to me. 0    Edinburgh Postnatal Depression Scale Total 1            Health Maintenance Due  Topic Date Due   INFLUENZA VACCINE  Never done   COVID-19 Vaccine (1 - 2024-25 season) Never done    The following portions of the patient's history were reviewed and updated as appropriate: allergies, current medications, past family history, past medical history, past social history, past surgical history, and problem list.  Review of Systems Pertinent items noted in HPI and remainder of  comprehensive ROS otherwise negative.  Objective:  BP 105/80   Pulse 81   Ht 5\' 1"  (1.549 m)   Wt 160 lb 9.6 oz (72.8 kg)   LMP 12/13/2022   Breastfeeding Yes Comment: Bottle also  BMI 30.35 kg/m    General:  alert, cooperative, and appears stated age   Breasts:  not indicated  Lungs: clear to auscultation bilaterally  Heart:  regular rate and rhythm, S1, S2 normal, no murmur, click, rub or gallop  Abdomen: soft, non-tender; bowel sounds normal; no masses,  no organomegaly   Wound Not indicated  GU exam:  not indicated       Assessment:   1. Gestational diabetes mellitus (GDM), antepartum, gestational diabetes method of control unspecified (Primary) 2. Postpartum care and examination Patient doing well 1hr GTT today due to time constraints Patient to f/u at health department for nexplanon insertion - Glucose tolerance, 1 hour   Plan:   Essential components of care per ACOG recommendations:  1.  Mood and well being: Patient with negative depression screening today. Reviewed local resources for support.  - Patient tobacco use? No.   - hx of drug use? No.    2. Infant care and feeding:  -Patient currently breastmilk feeding?  -Social determinants of health (SDOH) reviewed in EPIC. No concerns identified.   3. Sexuality, contraception and  birth spacing - Patient does not want a pregnancy in the next year, nor more children in the future.  - Reviewed reproductive life planning. Reviewed contraceptive methods based on pt preferences and effectiveness.  Patient desired nexplanon. Will get at health department given self-pay.  - Discussed birth spacing of 18 months  4. Sleep and fatigue -Encouraged family/partner/community support of 4 hrs of uninterrupted sleep to help with mood and fatigue  5. Physical Recovery  - Discussed patients delivery and complications. She describes her labor as good. - Patient had a Vaginal, no problems at delivery. Patient had no laceration.  -  Patient has urinary incontinence? No. - Patient is safe to resume physical and sexual activity  6.  Health Maintenance - HM due items addressed Yes - Last pap smear 10/17/22 NILM, HRHPV negative. Pap smear not indicated at today's visit.  -Breast Cancer screening indicated? No.   7. Chronic Disease/Pregnancy Condition follow up: Gestational Diabetes  - PCP follow up  Ralene Muskrat, PA-C [facility] [employer], [facility]

## 2023-11-13 LAB — GLUCOSE TOLERANCE, 1 HOUR: Glucose, 1Hr PP: 133 mg/dL (ref 70–199)

## 2023-11-15 ENCOUNTER — Encounter: Payer: Self-pay | Admitting: Physician Assistant

## 2024-05-06 ENCOUNTER — Encounter: Payer: Self-pay | Admitting: Emergency Medicine

## 2024-05-06 ENCOUNTER — Ambulatory Visit
Admission: EM | Admit: 2024-05-06 | Discharge: 2024-05-06 | Disposition: A | Discharge status: Home / Self Care | Attending: Nurse Practitioner | Admitting: Nurse Practitioner

## 2024-05-06 ENCOUNTER — Ambulatory Visit (HOSPITAL_BASED_OUTPATIENT_CLINIC_OR_DEPARTMENT_OTHER)
Admission: RE | Admit: 2024-05-06 | Discharge: 2024-05-06 | Disposition: A | Discharge status: Home / Self Care | Source: Ambulatory Visit | Attending: Obstetrics & Gynecology | Admitting: Obstetrics & Gynecology

## 2024-05-06 DIAGNOSIS — R1011 Right upper quadrant pain: Secondary | ICD-10-CM

## 2024-05-06 DIAGNOSIS — Z603 Acculturation difficulty: Secondary | ICD-10-CM | POA: Diagnosis not present

## 2024-05-06 DIAGNOSIS — R300 Dysuria: Secondary | ICD-10-CM

## 2024-05-06 DIAGNOSIS — N898 Other specified noninflammatory disorders of vagina: Secondary | ICD-10-CM | POA: Diagnosis not present

## 2024-05-06 DIAGNOSIS — R112 Nausea with vomiting, unspecified: Secondary | ICD-10-CM | POA: Diagnosis not present

## 2024-05-06 DIAGNOSIS — K760 Fatty (change of) liver, not elsewhere classified: Secondary | ICD-10-CM | POA: Insufficient documentation

## 2024-05-06 DIAGNOSIS — K8050 Calculus of bile duct without cholangitis or cholecystitis without obstruction: Secondary | ICD-10-CM

## 2024-05-06 LAB — POCT URINE DIPSTICK
Bilirubin, UA: NEGATIVE
Glucose, UA: NEGATIVE mg/dL
Ketones, POC UA: NEGATIVE mg/dL
Nitrite, UA: NEGATIVE
POC PROTEIN,UA: NEGATIVE
Spec Grav, UA: 1.01 (ref 1.010–1.025)
Urobilinogen, UA: 0.2 U/dL
pH, UA: 7 (ref 5.0–8.0)

## 2024-05-06 LAB — POCT URINE PREGNANCY: Preg Test, Ur: NEGATIVE

## 2024-05-06 MED ORDER — KETOROLAC TROMETHAMINE 30 MG/ML IJ SOLN
60.0000 mg | Freq: Once | INTRAMUSCULAR | Status: AC
  Administered 2024-05-06: 60 mg via INTRAMUSCULAR

## 2024-05-06 NOTE — ED Triage Notes (Signed)
 Spanish Interpretor Epifanio - Interpretor ID [id number]   Pt presents c/o abdomina pain in RUQ x 3 days. Pt reports not being able to eat due to pain. Pt reports the pain used to be occasional but is now becoming daily. Pt denies emesis but does c/o diarrhea and nausea. Pt reports no change in urination schedule.

## 2024-05-06 NOTE — Discharge Instructions (Addendum)
 Usted fue evaluada hoy por dolor en el lado derecho del abdomen que ha estado presente por varios aos y que se ha vuelto ms persistente desde el nacimiento de su beb. Su examen de hoy mostr sensibilidad en la parte superior derecha del abdomen, y su prueba de Psychiatrist en orina fue negativa. Su examen de orina mostr una pequea cantidad de sangre y glbulos blancos, pero sin seales de infeccin. Tambin se envi un hisopado vaginal y los resultados estn pendientes. Segn sus sntomas y examen, su dolor puede estar relacionado con la vescula biliar, y se ha ordenado una ecografa urgente de su abdomen para buscar clculos biliares u otras causas.  Por ahora, beba abundante lquido y siga una dieta blanda. Evite alimentos fritos, grasosos, picantes y lcteos, ya que pueden empeorar sus sntomas. Puede seguir tomando Tylenol  para Chief Technology Officer. El medicamento que recibi en la clnica debe ayudar con Environmental health practitioner. Descanse y evite actividades que empeoren su dolor.  Debe dar seguimiento con su mdico de atencin primaria o con un especialista una vez que estn [employer] de la [place], o antes si sus sntomas no mejoran.  Acuda de inmediato a la sala de emergencias si presenta dolor abdominal intenso o que empeora, fiebre, vmitos persistentes, color amarillo en la piel o los ojos, incapacidad para retener lquidos, o cualquier sntoma nuevo o preocupante.  You were seen today for right-sided abdominal pain that has been present for several years and has become more persistent since the birth of your baby. Your exam today showed tenderness in the right upper abdomen, and your urine pregnancy test was negative. Your urine test showed a small amount of blood and white cells but no signs of infection. A vaginal swab has also been sent, and results are pending. Based on your symptoms and exam, your pain may be related to your gallbladder, and an urgent ultrasound of your abdomen has been ordered to  look for gallstones or other causes. For now, drink plenty of fluids and try to follow a bland diet. Avoid fried, greasy, spicy, and dairy foods, as these may worsen your symptoms. You may continue using Tylenol  for pain. The medication you received in clinic should help with discomfort. Rest and avoid any activities that make your pain worse. We will call you if any of your results are abnormal. You should follow up with your primary care provider or a specialist once your ultrasound results return, or sooner if your symptoms do not improve. Go to the emergency department right away if you develop severe or worsening abdominal pain, fever, persistent vomiting, yellowing of your skin or eyes, inability to keep fluids down, or any new or concerning symptoms.

## 2024-05-06 NOTE — ED Provider Notes (Signed)
 [facility]    CSN: [id number] Arrival date & time: 05/06/24  0913      History   Chief Complaint Chief Complaint  Patient presents with   Abdominal Pain    HPI Jacqueline Watkins is a 38 y.o. female.   Spanish Interpreter used for this encounter  Susana [phone]  Discussed the use of AI scribe software for clinical note transcription with the patient, who gave verbal consent to proceed.   Patient presents with right abdominal pain that has been constant since January 2025, following the birth of her baby. The patient reports a 3-year history of right abdominal pain that became more persistent postpartum.  The pain is located in the right abdomen and wraps around to the back. It fluctuates in intensity, being sometimes strong and sometimes mild. The pain worsens after eating lasting about 2 hours before becoming milder. Greasy foods particularly exacerbate the discomfort. Associated symptoms include nausea without vomiting, frequent urination, and a sensation of needing to use the bathroom when the pain is strong. The patient denies fever or diarrhea. She reports dysuria as well as occasional white vaginal discharge with some irritation but no odor.  The patient has been taking Tylenol  for pain management. She is currently breastfeeding and is sexually active with her husband. Her last menstrual period began on August 5th, 2025, and she is not using birth control.  The following sections of the patient's history were reviewed and updated as appropriate: allergies, current medications, past family history, past medical history, past social history, past surgical history, and problem list.       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   Constipation 02/02/2021   History of sexually transmitted disease 01/11/2015   Gonorrhea and chlamydia 4-5 years ago     Iron deficiency anemia 12/24/2018   Kidney stones 03/08/2015    Microcytic anemia 09/11/2018   Neutrophilia 12/06/2018   Removal Reason: Historical     Pyelonephritis    Sepsis due to urinary tract infection (HCC) 09/11/2018   Steatosis of liver 01/11/2015   Umbilical hernia 12/06/2018   Fat-containing per CT at [facility]      Patient Active Problem List   Diagnosis Date Noted   GDM, class A1 09/19/2023   GBS (group B Streptococcus carrier), +RV culture, currently pregnant 08/30/2023   Language barrier 08/26/2023   H. pylori infection 06/11/2023   AMA (advanced maternal age) multigravida 35+ 04/18/2023   Obesity affecting pregnancy, antepartum 04/18/2023   Supervision of high risk pregnancy, antepartum 03/18/2023   GDM (gestational diabetes mellitus) 03/18/2023   History of VBAC x2    Adenomatous polyp of ascending colon 03/02/2021   Abnormal colonoscopy 03/02/2021   Abnormal CT scan, colon 02/02/2021   Other elevated white blood cell count 12/06/2018   History of C-section 2005    Past Surgical History:  Procedure Laterality Date   BIOPSY  04/10/2021   Procedure: BIOPSY;  Surgeon: Wilhelmenia Aloha Raddle., MD;  Location: [facility];  Service: Gastroenterology;;   CESAREAN SECTION  2005   COLONOSCOPY     COLONOSCOPY WITH PROPOFOL  N/A 04/10/2021   Procedure: COLONOSCOPY WITH PROPOFOL ;  Surgeon: Wilhelmenia Aloha Raddle., MD;  Location: [facility];  Service: Gastroenterology;  Laterality: N/A;   ENDOSCOPIC MUCOSAL RESECTION N/A 04/10/2021   Procedure: ENDOSCOPIC MUCOSAL RESECTION;  Surgeon: Wilhelmenia Aloha Raddle., MD;  Location: [facility];  Service: Gastroenterology;  Laterality: N/A;   ESOPHAGOGASTRODUODENOSCOPY (EGD) WITH PROPOFOL  N/A 04/10/2021   Procedure:  ESOPHAGOGASTRODUODENOSCOPY (EGD) WITH PROPOFOL ;  Surgeon: Mansouraty, Aloha Raddle., MD;  Location: [facility];  Service: Gastroenterology;  Laterality: N/A;   HEMOSTASIS CLIP PLACEMENT  04/10/2021   Procedure: HEMOSTASIS CLIP PLACEMENT;  Surgeon: Wilhelmenia Aloha Raddle., MD;   Location: [facility];  Service: Gastroenterology;;   HOT HEMOSTASIS N/A 04/10/2021   Procedure: HOT HEMOSTASIS (ARGON PLASMA COAGULATION/BICAP);  Surgeon: Wilhelmenia Aloha Raddle., MD;  Location: [facility];  Service: Gastroenterology;  Laterality: N/A;   POLYPECTOMY  04/10/2021   Procedure: POLYPECTOMY;  Surgeon: Wilhelmenia Aloha Raddle., MD;  Location: [facility];  Service: Gastroenterology;;    OB History     Gravida  5   Para  4   Term  3   Preterm  1   AB  1   Living  4      SAB  1   IAB  0   Ectopic  0   Multiple  0   Live Births  4            Home Medications    Prior to Admission medications   Not on File    Family History Family History  Problem Relation Age of Onset   Diabetes Mother    Heart disease Mother    Hypertension Mother    Colon cancer Neg Hx    Esophageal cancer Neg Hx    Inflammatory bowel disease Neg Hx    Liver disease Neg Hx    Pancreatic cancer Neg Hx    Rectal cancer Neg Hx    Stomach cancer Neg Hx     Social History Social History   Tobacco Use   Smoking status: Never   Smokeless tobacco: Never  Vaping Use   Vaping status: Never Used  Substance Use Topics   Alcohol use: No   Drug use: No     Allergies   Patient has no known allergies.   Review of Systems Review of Systems  Constitutional:  Negative for appetite change and fever.  Cardiovascular: Negative.   Gastrointestinal:  Positive for abdominal pain (RUQ radiating/wrapping around to the back) and nausea. Negative for diarrhea and vomiting.  Genitourinary:  Positive for dysuria, frequency and vaginal discharge. Negative for menstrual problem (LMP 03/31/24).       Some vaginal irritation. No vaginal itching or odor.   All other systems reviewed and are negative.    Physical Exam Triage Vital Signs ED Triage Vitals  Encounter Vitals Group     BP 05/06/24 1052 123/82     Girls Systolic BP Percentile --      Girls Diastolic BP Percentile --       Boys Systolic BP Percentile --      Boys Diastolic BP Percentile --      Pulse Rate 05/06/24 1052 95     Resp 05/06/24 1052 18     Temp 05/06/24 1052 98.4 F (36.9 C)     Temp Source 05/06/24 1052 Oral     SpO2 05/06/24 1052 99 %     Weight 05/06/24 1049 165 lb (74.8 kg)     Height --      Head Circumference --      Peak Flow --      Pain Score 05/06/24 1046 6     Pain Loc --      Pain Education --      Exclude from Growth Chart --    No data found.  Updated Vital Signs BP 123/82 (BP Location: Left Arm)  Pulse 95   Temp 98.4 F (36.9 C) (Oral)   Resp 18   Wt 165 lb (74.8 kg)   LMP 03/31/2024 (Approximate) Comment: Pty reports cycle is irregular since having last baby.  SpO2 99%   Breastfeeding Yes   BMI 31.18 kg/m   Visual Acuity Right Eye Distance:   Left Eye Distance:   Bilateral Distance:    Right Eye Near:   Left Eye Near:    Bilateral Near:     Physical Exam Vitals reviewed.  Constitutional:      General: She is awake. She is not in acute distress.    Appearance: Normal appearance. She is well-developed. She is not ill-appearing, toxic-appearing or diaphoretic.  HENT:     Head: Normocephalic.     Right Ear: Hearing normal.     Left Ear: Hearing normal.     Nose: Nose normal.     Mouth/Throat:     Mouth: Mucous membranes are moist.  Eyes:     General: Vision grossly intact.     Conjunctiva/sclera: Conjunctivae normal.  Cardiovascular:     Rate and Rhythm: Normal rate and regular rhythm.     Heart sounds: Normal heart sounds.  Pulmonary:     Effort: Pulmonary effort is normal.     Breath sounds: Normal breath sounds and air entry.  Abdominal:     General: Bowel sounds are normal. There is no distension.     Palpations: Abdomen is soft.     Tenderness: There is abdominal tenderness in the right upper quadrant and suprapubic area. There is guarding. There is no right CVA tenderness, left CVA tenderness or rebound.  Musculoskeletal:         General: Normal range of motion.     Cervical back: Full passive range of motion without pain, normal range of motion and neck supple.  Skin:    General: Skin is warm and dry.  Neurological:     General: No focal deficit present.     Mental Status: She is alert and oriented to person, place, and time.  Psychiatric:        Speech: Speech normal.        Behavior: Behavior is cooperative.      UC Treatments / Results  Labs (all labs ordered are listed, but only abnormal results are displayed) Labs Reviewed  POCT URINE DIPSTICK - Abnormal; Notable for the following components:      Result Value   Blood, UA trace-intact (*)    Leukocytes, UA Small (1+) (*)    All other components within normal limits  POCT URINE PREGNANCY - Normal  URINE CULTURE  CERVICOVAGINAL ANCILLARY ONLY    EKG   Radiology No results found.  Procedures Procedures (including critical care time)  Medications Ordered in UC Medications  ketorolac  (TORADOL ) 30 MG/ML injection 60 mg (60 mg Intramuscular Given 05/06/24 1144)    Initial Impression / Assessment and Plan / UC Course  I have reviewed the triage vital signs and the nursing notes.  Pertinent labs & imaging results that were available during my care of the patient were reviewed by me and considered in my medical decision making (see chart for details).     The patient presents with a 3-year history of intermittent right-sided abdominal pain, which has become more persistent since the birth of her baby in January. The pain fluctuates in intensity and is exacerbated by greasy foods. Associated symptoms include nausea, urinary frequency, dysuria, occasional white vaginal discharge, and  mild vaginal irritation. She denies fever, diarrhea, or abnormal vaginal odor. She has been using acetaminophen  for pain control. Patient is currently breastfeeding, sexually active only with her husband, and not on birth control.  On exam, she is afebrile, nontoxic,  and has right upper quadrant tenderness with some guarding but no rebound tenderness. Urine pregnancy test was negative. Urinalysis revealed trace red blood cells and small leukocytes without nitrites or other significant abnormalities. Urine culture and cervicovaginal ancillary swab obtained and pending.  In clinic, she was treated with Toradol  60 mg IM for pain. Given the chronicity and current presentation, biliary colic is suspected. A STAT right upper quadrant ultrasound was ordered for further evaluation. The patient was advised to maintain hydration, consume a bland diet, and avoid dairy, fried, greasy, or spicy foods. Further recommendations will be made based on ultrasound and pending swab results.  Today's evaluation has revealed no signs of a dangerous process. Discussed diagnosis with patient and/or guardian. Patient and/or guardian aware of their diagnosis, possible red flag symptoms to watch out for and need for close follow up. Patient and/or guardian understands verbal and written discharge instructions. Patient and/or guardian comfortable with plan and disposition.  Patient and/or guardian has a clear mental status at this time, good insight into illness (after discussion and teaching) and has clear judgment to make decisions regarding their care  Documentation was completed with the aid of voice recognition software. Transcription may contain typographical errors.  Final Clinical Impressions(s) / UC Diagnoses   Final diagnoses:  RUQ abdominal pain  Biliary colic  Dysuria     Discharge Instructions      Usted fue evaluada hoy por dolor en el lado derecho del abdomen que ha estado presente por varios aos y que se ha vuelto ms persistente desde el nacimiento de su beb. Su examen de hoy mostr sensibilidad en la parte superior derecha del abdomen, y su prueba de Psychiatrist en orina fue negativa. Su examen de orina mostr una pequea cantidad de sangre y glbulos blancos, pero sin  seales de infeccin. Tambin se envi un hisopado vaginal y los resultados estn pendientes. Segn sus sntomas y examen, su dolor puede estar relacionado con la vescula biliar, y se ha ordenado una ecografa urgente de su abdomen para buscar clculos biliares u otras causas.  Por ahora, beba abundante lquido y siga una dieta blanda. Evite alimentos fritos, grasosos, picantes y lcteos, ya que pueden empeorar sus sntomas. Puede seguir tomando Tylenol  para Chief Technology Officer. El medicamento que recibi en la clnica debe ayudar con Environmental health practitioner. Descanse y evite actividades que empeoren su dolor.  Debe dar seguimiento con su mdico de atencin primaria o con un especialista una vez que estn [employer] de la [place], o antes si sus sntomas no mejoran.  Acuda de inmediato a la sala de emergencias si presenta dolor abdominal intenso o que empeora, fiebre, vmitos persistentes, color amarillo en la piel o los ojos, incapacidad para retener lquidos, o cualquier sntoma nuevo o preocupante.  You were seen today for right-sided abdominal pain that has been present for several years and has become more persistent since the birth of your baby. Your exam today showed tenderness in the right upper abdomen, and your urine pregnancy test was negative. Your urine test showed a small amount of blood and white cells but no signs of infection. A vaginal swab has also been sent, and results are pending. Based on your symptoms and exam, your pain may be related to  your gallbladder, and an urgent ultrasound of your abdomen has been ordered to look for gallstones or other causes. For now, drink plenty of fluids and try to follow a bland diet. Avoid fried, greasy, spicy, and dairy foods, as these may worsen your symptoms. You may continue using Tylenol  for pain. The medication you received in clinic should help with discomfort. Rest and avoid any activities that make your pain worse. We will call you if any of your  results are abnormal. You should follow up with your primary care provider or a specialist once your ultrasound results return, or sooner if your symptoms do not improve. Go to the emergency department right away if you develop severe or worsening abdominal pain, fever, persistent vomiting, yellowing of your skin or eyes, inability to keep fluids down, or any new or concerning symptoms.     ED Prescriptions   None    PDMP not reviewed this encounter.   Iola Lukes, [place] 05/06/24 1208

## 2024-05-07 ENCOUNTER — Ambulatory Visit: Payer: Self-pay | Admitting: Nurse Practitioner

## 2024-05-07 LAB — CERVICOVAGINAL ANCILLARY ONLY
Bacterial Vaginitis (gardnerella): NEGATIVE
Candida Glabrata: NEGATIVE
Candida Vaginitis: POSITIVE — AB
Chlamydia: NEGATIVE
Comment: NEGATIVE
Comment: NEGATIVE
Comment: NEGATIVE
Comment: NEGATIVE
Comment: NEGATIVE
Comment: NORMAL
Neisseria Gonorrhea: NEGATIVE
Trichomonas: NEGATIVE

## 2024-05-07 LAB — URINE CULTURE: Culture: 20000 — AB

## 2024-05-08 MED ORDER — FLUCONAZOLE 150 MG PO TABS: 150.0000 mg | ORAL_TABLET | Freq: Once | ORAL | 0 refills | Status: AC

## 2024-05-08 NOTE — Telephone Encounter (Signed)
 Also reviewed u/s results and FNP recs using interpreter line.

## 2024-05-11 ENCOUNTER — Ambulatory Visit (INDEPENDENT_AMBULATORY_CARE_PROVIDER_SITE_OTHER)

## 2024-05-11 VITALS — BP 118/84 | HR 81 | Temp 97.5°F | Ht 61.0 in | Wt 168.0 lb

## 2024-05-11 DIAGNOSIS — F5104 Psychophysiologic insomnia: Secondary | ICD-10-CM | POA: Diagnosis not present

## 2024-05-11 DIAGNOSIS — Z1321 Encounter for screening for nutritional disorder: Secondary | ICD-10-CM

## 2024-05-11 DIAGNOSIS — D122 Benign neoplasm of ascending colon: Secondary | ICD-10-CM | POA: Diagnosis not present

## 2024-05-11 DIAGNOSIS — Z130 Encounter for screening for diseases of the blood and blood-forming organs and certain disorders involving the immune mechanism: Secondary | ICD-10-CM

## 2024-05-11 DIAGNOSIS — Z1329 Encounter for screening for other suspected endocrine disorder: Secondary | ICD-10-CM

## 2024-05-11 DIAGNOSIS — G4700 Insomnia, unspecified: Secondary | ICD-10-CM | POA: Insufficient documentation

## 2024-05-11 DIAGNOSIS — Z7689 Persons encountering health services in other specified circumstances: Secondary | ICD-10-CM | POA: Insufficient documentation

## 2024-05-11 DIAGNOSIS — K7689 Other specified diseases of liver: Secondary | ICD-10-CM | POA: Diagnosis not present

## 2024-05-11 DIAGNOSIS — Z13228 Encounter for screening for other metabolic disorders: Secondary | ICD-10-CM

## 2024-05-11 DIAGNOSIS — Z23 Encounter for immunization: Secondary | ICD-10-CM | POA: Diagnosis not present

## 2024-05-11 DIAGNOSIS — R1011 Right upper quadrant pain: Secondary | ICD-10-CM | POA: Diagnosis not present

## 2024-05-11 DIAGNOSIS — Z1211 Encounter for screening for malignant neoplasm of colon: Secondary | ICD-10-CM

## 2024-05-11 DIAGNOSIS — G8929 Other chronic pain: Secondary | ICD-10-CM | POA: Insufficient documentation

## 2024-05-11 MED ORDER — IBUPROFEN 800 MG PO TABS: 800.0000 mg | ORAL_TABLET | Freq: Three times a day (TID) | ORAL | 0 refills | Status: DC | PRN

## 2024-05-11 MED ORDER — TRAZODONE HCL 50 MG PO TABS: 25.0000 mg | ORAL_TABLET | Freq: Every evening | ORAL | 1 refills | Status: AC | PRN

## 2024-05-11 NOTE — Patient Instructions (Signed)
 VISIT SUMMARY: Today, you were seen for persistent abdominal pain, a liver hemangioma, and a history of intestinal polyps. We discussed your symptoms, including pain that worsens after eating certain foods and disrupts your sleep. We also reviewed your irregular menstrual cycles since the birth of your last child, likely due to breastfeeding.  YOUR PLAN: -CHRONIC RIGHT UPPER QUADRANT ABDOMINAL PAIN: Your abdominal pain may be related to a liver hemangioma or cyst and is disrupting your sleep. We have refilled your pain medication and provided a list of foods to avoid to help manage your symptoms.  -LIVER HEMANGIOMA OR CYST UNDER EVALUATION: A liver hemangioma or cyst was identified on your ultrasound. We need to perform an MRI for further evaluation and have ordered baseline blood work to check your liver and kidney function.  -INSOMNIA SECONDARY TO PAIN: Your difficulty staying asleep is likely due to the pain. We have prescribed trazodone , starting with 25 mg and increasing to 50 mg if needed, to help you sleep better.  -COLONIC POLYP, NEEDS SURVEILLANCE COLONOSCOPY: You have a history of a large intestinal polyp and are overdue for a follow-up colonoscopy. We have ordered a surveillance colonoscopy to monitor your condition.  -IRREGULAR MENSES LIKELY SECONDARY TO BREASTFEEDING: Your irregular menstrual cycles are likely due to breastfeeding, and no intervention is needed at this time.  -GENERAL HEALTH MAINTENANCE: You are up to date on cervical cancer screening and not due for a mammogram or routine colonoscopy. We administered your flu shot today and provided a list of local dentists for you to establish dental care.  INSTRUCTIONS: Please schedule an MRI of your liver at [facility] and complete the baseline blood work as soon as possible. Also, schedule your surveillance colonoscopy. Follow the dietary recommendations provided to help manage your abdominal pain. Take trazodone  as  prescribed to help with sleep. Continue monitoring your menstrual cycles, but no immediate action is needed. Establish care with a local dentist using the list provided.  If you have any problems before your next visit feel free to message me via MyChart (minor issues or questions) or call the office, otherwise you may reach out to schedule an office visit.  Thank you! Saddie Sacks, PA-C

## 2024-05-11 NOTE — Assessment & Plan Note (Signed)
 Found in 2022, 6 month follow up colonoscopy recommended. Did not follow up due to financial issues and then pregnancy. New order for surveillance colonoscopy placed today.

## 2024-05-11 NOTE — Progress Notes (Signed)
 New Patient Office Visit  Subjective    Patient ID: Jacqueline Watkins, female    DOB: June 30, 1986  Age: 38 y.o. MRN: [id number]  CC:  Chief Complaint  Patient presents with   New Patient (Initial Visit)    History of Present Illness   Jacqueline Watkins is a 38 year old female who presents to establish care. This is her first primary care appointment. She gave birth to a son 7 months ago and is currently breast feeding. Reports that she is not sleeping well secondary to RUQ pain. Diet consists of fried/greasy foods, which she does report she has had to cut back on recently due to exacerbation of her RUQ pain. She is not currently exercising regularly. Reports that her bowel movements are regular. Periods have not been regular since the birth on her son 7 months ago, but she is still currently breast feeding.   Screenings:  Colon Cancer: Last colonoscopy in 2022, was due for a 6 month repeat but lost to follow up. Will order new colonoscopy today Lung Cancer: N/A - nonsmoker Breast Cancer: N/A - will start at 40 Diabetes: checking A1c today with labs HLD: checking lipid panel with labs The ASCVD Risk score (Arnett DK, et al., 2019) failed to calculate for the following reasons:   The 2019 ASCVD risk score is only valid for ages 70 to 63  Acute Problems:  RUQ Abdominal pain - Persistent abdominal pain for approximately three years - Worsening pain following the birth of her last child seven months ago - Pain intensifies after eating, especially with greasy and citrus foods - Pain frequently wakes her at night around 1 to 2 AM, making it difficult to return to sleep - Pain sometimes radiates to her back - No diarrhea or blood in stools, no vomiting - Regular bowel movements - No issues with falling asleep initially, but sleep is disrupted by pain  Hepatic lesion - Ultrasound of RUQ ordered at urgent care 1 week ago identified hepatic steatosis and a cyst or hemangioma on the  liver (approx 1 cm) which requires MRI follow up  Intestinal polyps - History of large 45 mm intestinal polyp found in 2022 - No recent colonoscopy despite recommendation for follow-up 6 months after that colonoscopy in 2022  - It has been two years since her last colonoscopy  Outpatient Encounter Medications as of 05/11/2024  Medication Sig   fluconazole  (DIFLUCAN ) 150 MG tablet Take 150 mg by mouth once.   ibuprofen  (ADVIL ) 800 MG tablet Take 1 tablet (800 mg total) by mouth every 8 (eight) hours as needed for moderate pain (pain score 4-6) or cramping.   traZODone  (DESYREL ) 50 MG tablet Take 0.5-1 tablets (25-50 mg total) by mouth at bedtime as needed for sleep.   [DISCONTINUED] ibuprofen  (ADVIL ) 800 MG tablet Take 1 tablet 3 times a day by oral route as needed.   No facility-administered encounter medications on file as of 05/11/2024.    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   Constipation 02/02/2021   History of sexually transmitted disease 01/11/2015   Gonorrhea and chlamydia 4-5 years ago     Iron deficiency anemia 12/24/2018   Kidney stones 03/08/2015   Microcytic anemia 09/11/2018   Neutrophilia 12/06/2018   Removal Reason: Historical     Pyelonephritis    Sepsis due to urinary tract infection (HCC) 09/11/2018   Steatosis of liver 01/11/2015   Umbilical hernia 12/06/2018   Fat-containing per CT at [facility]  [place]      Past Surgical History:  Procedure Laterality Date   BIOPSY  04/10/2021   Procedure: BIOPSY;  Surgeon: Wilhelmenia Aloha Raddle., MD;  Location: [facility];  Service: Gastroenterology;;   CESAREAN SECTION  2005   COLONOSCOPY     COLONOSCOPY WITH PROPOFOL  N/A 04/10/2021   Procedure: COLONOSCOPY WITH PROPOFOL ;  Surgeon: Wilhelmenia Aloha Raddle., MD;  Location: [facility];  Service: Gastroenterology;  Laterality: N/A;   ENDOSCOPIC MUCOSAL RESECTION N/A 04/10/2021   Procedure: ENDOSCOPIC MUCOSAL RESECTION;  Surgeon:  Wilhelmenia Aloha Raddle., MD;  Location: [facility];  Service: Gastroenterology;  Laterality: N/A;   ESOPHAGOGASTRODUODENOSCOPY (EGD) WITH PROPOFOL  N/A 04/10/2021   Procedure: ESOPHAGOGASTRODUODENOSCOPY (EGD) WITH PROPOFOL ;  Surgeon: Wilhelmenia Aloha Raddle., MD;  Location: [facility];  Service: Gastroenterology;  Laterality: N/A;   HEMOSTASIS CLIP PLACEMENT  04/10/2021   Procedure: HEMOSTASIS CLIP PLACEMENT;  Surgeon: Wilhelmenia Aloha Raddle., MD;  Location: [facility];  Service: Gastroenterology;;   HOT HEMOSTASIS N/A 04/10/2021   Procedure: HOT HEMOSTASIS (ARGON PLASMA COAGULATION/BICAP);  Surgeon: Wilhelmenia Aloha Raddle., MD;  Location: [facility];  Service: Gastroenterology;  Laterality: N/A;   POLYPECTOMY  04/10/2021   Procedure: POLYPECTOMY;  Surgeon: Mansouraty, Aloha Raddle., MD;  Location: [facility];  Service: Gastroenterology;;    Family History  Problem Relation Age of Onset   Diabetes Mother    Heart disease Mother    Hypertension Mother    Stomach cancer Father    Colon cancer Neg Hx    Esophageal cancer Neg Hx    Inflammatory bowel disease Neg Hx    Liver disease Neg Hx    Pancreatic cancer Neg Hx    Rectal cancer Neg Hx     Social History   Socioeconomic History   Marital status: Married    Spouse name: Steffan Jubilee   Number of children: 4   Years of education: Not on file   Highest education level: Not on file  Occupational History   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 No   Diet: How do you rate your diet? Fair   Do you eat or drink 4 servings of daily or soy  daily or take calcium  supplements?No   Exercise:    Do you exercise regularly?No   What type of exercise?   How long(minutes)?   How often?    Safety:    Do you wear a bike helmet? N/A   Do you use your seatbelts constantly?Yes   Is violence at home a concern for you?No   Have you ever been abused?No   Do you have a gun in your home?No   Social Drivers of Corporate investment banker Strain: Not on file  Food Insecurity: No Food Insecurity (09/19/2023)   Hunger Vital Sign    Worried About Running Out of Food in the Last Year: Never true    Ran Out of Food in the Last Year: Never true  Transportation Needs: No Transportation Needs (09/19/2023)   PRAPARE - Transportation  Lack of Transportation (Medical): No    Lack of Transportation (Non-Medical): No  Physical Activity: Not on file  Stress: Not on file  Social Connections: Not on file  Intimate Partner Violence: Not At Risk (09/19/2023)   Humiliation, Afraid, Rape, and Kick questionnaire    Fear of Current or Ex-Partner: No    Emotionally Abused: No    Physically Abused: No    Sexually Abused: No    ROS  Per HPI      Objective    Ht 5' 1 (1.549 m)   LMP 03/31/2024 (Approximate) Comment: Pty reports cycle is irregular since having last baby.  BMI 31.18 kg/m   Physical Exam Constitutional:      General: She is not in acute distress.    Appearance: Normal appearance.  HENT:     Right Ear: Tympanic membrane normal.     Left Ear: Tympanic membrane normal.     Mouth/Throat:     Pharynx: Oropharynx is clear.  Eyes:     Pupils: Pupils are equal, round, and reactive to light.  Cardiovascular:     Rate and Rhythm: Normal rate and regular rhythm.     Heart sounds: Normal heart sounds. No murmur heard.    No friction rub. No gallop.  Pulmonary:     Effort: Pulmonary effort is normal. No respiratory distress.     Breath sounds: Normal breath sounds.  Abdominal:     General: Bowel sounds are normal.      Palpations: Abdomen is soft.     Tenderness: There is abdominal tenderness (TTP RUQ).     Comments: -negative murphy's sign  Musculoskeletal:        General: No swelling.     Cervical back: Neck supple.  Lymphadenopathy:     Cervical: No cervical adenopathy.  Skin:    General: Skin is warm and dry.  Neurological:     General: No focal deficit present.     Mental Status: She is alert.  Psychiatric:        Mood and Affect: Mood normal.        Behavior: Behavior normal.        Thought Content: Thought content normal.          Assessment & Plan:   Screening for endocrine, nutritional, metabolic and immunity disorder -     VITAMIN D  25 Hydroxy (Vit-D Deficiency, Fractures); Future -     TSH; Future -     Hemoglobin A1c; Future -     Lipid panel; Future -     Comprehensive metabolic panel with GFR; Future -     CBC with Differential/Platelet; Future  Liver cyst -     MR LIVER W CONTRAST; Future -     Gamma GT; Future -     Lipase; Future  Encounter for vaccination -     Flu vaccine trivalent PF, 6mos and older(Flulaval,Afluria,Fluarix,Fluzone)  Screening for colon cancer -     Ambulatory referral to Gastroenterology  Encounter to establish care Assessment & Plan: Labs today, see orders. Answered all patient questions. Went over and encouraged/ordered age-appropriate health screenings to include colonoscopy. Patient is up-to-date on all other screenings. Flu vaccine today. Encouraged patient to aim for 150+ minutes of physical activity per week or just increase their physical activity in general in combination with a well balanced diet that prioritizes protein and fiber to support a healthy lifestyle. List of local dentists in area provided to patient today. Patient verbalized understanding and  was in agreement with the plan.    Adenomatous polyp of ascending colon Assessment & Plan: Found in 2022, 6 month follow up colonoscopy recommended. Did not follow up due to  financial issues and then pregnancy. New order for surveillance colonoscopy placed today.    Chronic RUQ pain Assessment & Plan: -Liver U/S last week revealed hepatic steatosis and an approx 1 cm liver hemangioma or cyst, requiring MRI follow up.  -MRI ordered today  -Prescribe 800 mg Ibuprofen  TID PRN for pain control, trazodone  to help with sleep (25-50 mg nightly at bedtime) -Education on biliary colic and foods to avoid provided today in Spanish  - Checking CMP, lipase, GGT today.  - Follow up in 4 weeks to discuss MRI and reassess pain control   Psychophysiological insomnia Assessment & Plan: Insomnia due to RUQ pain, difficulty staying asleep. - Prescribe trazodone  50 mg tablet for sleep, start with 25 mg and increase to 50 mg if needed.   Other orders -     traZODone  HCl; Take 0.5-1 tablets (25-50 mg total) by mouth at bedtime as needed for sleep.  Dispense: 30 tablet; Refill: 1 -     Ibuprofen ; Take 1 tablet (800 mg total) by mouth every 8 (eight) hours as needed for moderate pain (pain score 4-6) or cramping.  Dispense: 30 tablet; Refill: 0    Return in about 4 weeks (around 06/08/2024) for Go over MRI, reassess RUQ pain.   Saddie JULIANNA Sacks, PA-C

## 2024-05-11 NOTE — Assessment & Plan Note (Signed)
 Insomnia due to RUQ pain, difficulty staying asleep. - Prescribe trazodone  50 mg tablet for sleep, start with 25 mg and increase to 50 mg if needed.

## 2024-05-11 NOTE — Assessment & Plan Note (Signed)
-  Liver U/S last week revealed hepatic steatosis and an approx 1 cm liver hemangioma or cyst, requiring MRI follow up.  -MRI ordered today  -Prescribe 800 mg Ibuprofen  TID PRN for pain control, trazodone  to help with sleep (25-50 mg nightly at bedtime) -Education on biliary colic and foods to avoid provided today in Spanish  - Checking CMP, lipase, GGT today.  - Follow up in 4 weeks to discuss MRI and reassess pain control

## 2024-05-11 NOTE — Assessment & Plan Note (Addendum)
 Labs today, see orders. Answered all patient questions. Went over and encouraged/ordered age-appropriate health screenings to include colonoscopy. Patient is up-to-date on all other screenings. Flu vaccine today. Encouraged patient to aim for 150+ minutes of physical activity per week or just increase their physical activity in general in combination with a well balanced diet that prioritizes protein and fiber to support a healthy lifestyle. List of local dentists in area provided to patient today. Patient verbalized understanding and was in agreement with the plan.

## 2024-05-12 LAB — CBC WITH DIFFERENTIAL/PLATELET
Absolute Lymphocytes: 2236 {cells}/uL (ref 850–3900)
Absolute Monocytes: 507 {cells}/uL (ref 200–950)
Basophils Absolute: 43 {cells}/uL (ref 0–200)
Basophils Relative: 0.5 %
Eosinophils Absolute: 172 {cells}/uL (ref 15–500)
Eosinophils Relative: 2 %
HCT: 42 % (ref 35.0–45.0)
Hemoglobin: 12.9 g/dL (ref 11.7–15.5)
MCH: 25 pg — ABNORMAL LOW (ref 27.0–33.0)
MCHC: 30.7 g/dL — ABNORMAL LOW (ref 32.0–36.0)
MCV: 81.2 fL (ref 80.0–100.0)
MPV: 11.4 fL (ref 7.5–12.5)
Monocytes Relative: 5.9 %
Neutro Abs: 5642 {cells}/uL (ref 1500–7800)
Neutrophils Relative %: 65.6 %
Platelets: 345 Thousand/uL (ref 140–400)
RBC: 5.17 Million/uL — ABNORMAL HIGH (ref 3.80–5.10)
RDW: 14 % (ref 11.0–15.0)
Total Lymphocyte: 26 %
WBC: 8.6 Thousand/uL (ref 3.8–10.8)

## 2024-05-12 LAB — COMPREHENSIVE METABOLIC PANEL WITH GFR
AG Ratio: 1.5 (calc) (ref 1.0–2.5)
ALT: 12 U/L (ref 6–29)
AST: 10 U/L (ref 10–30)
Albumin: 4.8 g/dL (ref 3.6–5.1)
Alkaline phosphatase (APISO): 93 U/L (ref 31–125)
BUN: 14 mg/dL (ref 7–25)
CO2: 20 mmol/L (ref 20–32)
Calcium: 9.3 mg/dL (ref 8.6–10.2)
Chloride: 108 mmol/L (ref 98–110)
Creat: 0.73 mg/dL (ref 0.50–0.97)
Globulin: 3.2 g/dL (ref 1.9–3.7)
Glucose, Bld: 72 mg/dL (ref 65–99)
Potassium: 4.2 mmol/L (ref 3.5–5.3)
Sodium: 138 mmol/L (ref 135–146)
Total Bilirubin: 0.3 mg/dL (ref 0.2–1.2)
Total Protein: 8 g/dL (ref 6.1–8.1)
eGFR: 108 mL/min/1.73m2 (ref >=60)

## 2024-05-12 LAB — HEMOGLOBIN A1C
Hgb A1c MFr Bld: 5.5 % (ref <5.7)
Mean Plasma Glucose: 111 mg/dL
eAG (mmol/L): 6.2 mmol/L

## 2024-05-12 LAB — TSH: TSH: 0.75 m[IU]/L

## 2024-05-12 LAB — LIPID PANEL
Cholesterol: 224 mg/dL — ABNORMAL HIGH (ref <200)
HDL: 52 mg/dL (ref >=50)
LDL Cholesterol (Calc): 149 mg/dL — ABNORMAL HIGH
Non-HDL Cholesterol (Calc): 172 mg/dL — ABNORMAL HIGH (ref <130)
Total CHOL/HDL Ratio: 4.3 (calc) (ref <5.0)
Triglycerides: 112 mg/dL (ref <150)

## 2024-05-12 LAB — GAMMA GT: GGT: 22 U/L (ref 3–50)

## 2024-05-12 LAB — VITAMIN D 25 HYDROXY (VIT D DEFICIENCY, FRACTURES): Vit D, 25-Hydroxy: 26 ng/mL — ABNORMAL LOW (ref 30–100)

## 2024-05-12 LAB — LIPASE: Lipase: 33 U/L (ref 7–60)

## 2024-05-13 ENCOUNTER — Ambulatory Visit: Payer: Self-pay

## 2024-06-01 NOTE — Addendum Note (Signed)
 Addended byBETHA GAYLE NUMBERS on: 06/01/2024 12:58 PM   Modules accepted: Orders

## 2024-06-03 NOTE — Telephone Encounter (Signed)
 Called [employer] and spoke with Chrystal [phone]) who related the message from provider - Saddie Sacks. Patient is aware that someone will be reaching to schedule her appointment for MRI and Colonoscopy.

## 2024-06-11 ENCOUNTER — Ambulatory Visit (INDEPENDENT_AMBULATORY_CARE_PROVIDER_SITE_OTHER)

## 2024-06-11 VITALS — BP 115/75 | HR 90 | Temp 98.1°F | Ht 61.0 in | Wt 166.0 lb

## 2024-06-11 DIAGNOSIS — E559 Vitamin D deficiency, unspecified: Secondary | ICD-10-CM

## 2024-06-11 DIAGNOSIS — R1011 Right upper quadrant pain: Secondary | ICD-10-CM

## 2024-06-11 DIAGNOSIS — E785 Hyperlipidemia, unspecified: Secondary | ICD-10-CM | POA: Diagnosis not present

## 2024-06-11 DIAGNOSIS — K7689 Other specified diseases of liver: Secondary | ICD-10-CM | POA: Diagnosis not present

## 2024-06-11 DIAGNOSIS — G8929 Other chronic pain: Secondary | ICD-10-CM

## 2024-06-11 MED ORDER — IBUPROFEN 800 MG PO TABS: 800.0000 mg | ORAL_TABLET | Freq: Three times a day (TID) | ORAL | 0 refills | Status: AC | PRN

## 2024-06-11 NOTE — Patient Instructions (Signed)
 VISIT SUMMARY: Today, we discussed your ongoing right upper quadrant pain and its possible connection to a liver hemangioma or cyst. We also addressed your low vitamin D  levels, which may be contributing to your bone and joint pain.  YOUR PLAN: RIGHT UPPER QUADRANT ABDOMINAL PAIN: You have persistent pain in your right upper abdomen, which may be related to a liver hemangioma or cyst. The pain worsens with certain foods like coffee and greasy or fatty foods. -We will refer you to a liver specialist for further evaluation and a potential MRI. -We will contact [facility] to help resolve the MRI insurance issues. -Continue taking ibuprofen  800 mg as needed for pain, but be cautious of potential side effects. -Try to avoid greasy foods and coffee to help manage your pain. -We have sent a refill for ibuprofen  to your pharmacy.  VITAMIN D  INSUFFICIENCY: Your vitamin D  levels are low, which may be causing your bone and joint pain. -Start taking a vitamin D  supplement.  If you have any problems before your next visit feel free to message me via MyChart (minor issues or questions) or call the office, otherwise you may reach out to schedule an office visit.  Thank you! Saddie Sacks, PA-C

## 2024-06-12 ENCOUNTER — Ambulatory Visit: Payer: Self-pay

## 2024-06-12 DIAGNOSIS — E559 Vitamin D deficiency, unspecified: Secondary | ICD-10-CM | POA: Insufficient documentation

## 2024-06-12 DIAGNOSIS — E785 Hyperlipidemia, unspecified: Secondary | ICD-10-CM | POA: Insufficient documentation

## 2024-06-12 NOTE — Assessment & Plan Note (Signed)
-  Liver U/S in Sept 2025 revealed hepatic steatosis and an approx 1 cm liver hemangioma or cyst, requiring MRI follow up.  - MRI order placed on 05/11/24 however patient states she has not been able to get the MRI due to insurance issues.  - Chronic right upper quadrant pain likely due to liver hemangioma or cyst. Pain exacerbated by greasy foods and coffee. Hemangioma incidental finding. - Refer to liver specialist for evaluation  - Contact [facility] to resolve MRI insurance issues. - Advise ibuprofen  800 mg as needed for pain, cautioning potential side effects. - Recommend dietary modifications to avoid greasy foods and coffee. - Send ibuprofen  refill to pharmacy.

## 2024-06-12 NOTE — Progress Notes (Signed)
 Established Patient Office Visit  Subjective   Patient ID: Jacqueline Watkins, female    DOB: 03-08-1986  Age: 38 y.o. MRN: [id number]  Chief Complaint  Patient presents with   Medical Management of Chronic Issues    HPI   History of Present Illness   Jacqueline Watkins is a 38 year old female who presents with right upper quadrant pain.  Right upper quadrant abdominal pain - Persistent right upper quadrant pain, unchanged since last visit - Pain is intermittent and can become severe - Pain is managed with ibuprofen  800 mg as needed, but use is limited due to concerns about long-term effects - Pain is exacerbated by certain foods, particularly coffee and greasy or fatty foods, typically beginning about an hour after consumption - Initially evaluated in the emergency room in September; ultrasound showed a liver lesion, possibly a hemangioma or cyst - Unable to obtain MRI due to insurance issues  Gastrointestinal symptoms - No changes in bowel habits - Regular daily bowel movements        ROS Per HPI.    Objective:     BP 115/75   Pulse 90   Temp 98.1 F (36.7 C) (Oral)   Ht 5' 1 (1.549 m)   Wt 166 lb 0.6 oz (75.3 kg)   SpO2 99%   BMI 31.37 kg/m    Physical Exam Constitutional:      General: She is not in acute distress.    Appearance: Normal appearance.  Cardiovascular:     Rate and Rhythm: Normal rate and regular rhythm.     Heart sounds: Normal heart sounds. No murmur heard.    No friction rub. No gallop.  Pulmonary:     Effort: Pulmonary effort is normal. No respiratory distress.     Breath sounds: Normal breath sounds.  Abdominal:     General: Abdomen is flat. Bowel sounds are normal.     Palpations: Abdomen is soft.     Tenderness: There is abdominal tenderness (RUQ).  Musculoskeletal:        General: No swelling.  Skin:    General: Skin is warm and dry.  Neurological:     General: No focal deficit present.     Mental Status: She is  alert.  Psychiatric:        Mood and Affect: Mood normal.        Behavior: Behavior normal.        Thought Content: Thought content normal.      No results found for any visits on 06/11/24.  Last CBC Lab Results  Component Value Date   WBC 8.6 05/11/2024   HGB 12.9 05/11/2024   HCT 42.0 05/11/2024   MCV 81.2 05/11/2024   MCH 25.0 (L) 05/11/2024   RDW 14.0 05/11/2024   PLT 345 05/11/2024   Last metabolic panel Lab Results  Component Value Date   GLUCOSE 72 05/11/2024   NA 138 05/11/2024   K 4.2 05/11/2024   CL 108 05/11/2024   CO2 20 05/11/2024   BUN 14 05/11/2024   CREATININE 0.73 05/11/2024   EGFR 108 05/11/2024   CALCIUM  9.3 05/11/2024   PROT 8.0 05/11/2024   ALBUMIN 2.7 (L) 09/19/2023   LABGLOB 3.0 07/11/2023   BILITOT 0.3 05/11/2024   ALKPHOS 300 (H) 09/19/2023   AST 10 05/11/2024   ALT 12 05/11/2024   ANIONGAP 10 09/19/2023   Last lipids Lab Results  Component Value Date   CHOL 224 (H) 05/11/2024   HDL 52 05/11/2024  LDLCALC 149 (H) 05/11/2024   TRIG 112 05/11/2024   CHOLHDL 4.3 05/11/2024   Last hemoglobin A1c Lab Results  Component Value Date   HGBA1C 5.5 05/11/2024   Last thyroid functions Lab Results  Component Value Date   TSH 0.75 05/11/2024   Last vitamin D  Lab Results  Component Value Date   VD25OH 26 (L) 05/11/2024      The ASCVD Risk score (Arnett DK, et al., 2019) failed to calculate for the following reasons:   The 2019 ASCVD risk score is only valid for ages 54 to 39    Assessment & Plan:   Liver cyst -     Ambulatory referral to Gastroenterology  Chronic RUQ pain Assessment & Plan: -Liver U/S in Sept 2025 revealed hepatic steatosis and an approx 1 cm liver hemangioma or cyst, requiring MRI follow up.  - MRI order placed on 05/11/24 however patient states she has not been able to get the MRI due to insurance issues.  - Chronic right upper quadrant pain likely due to liver hemangioma or cyst. Pain exacerbated by greasy  foods and coffee. Hemangioma incidental finding. - Refer to liver specialist for evaluation  - Contact [facility] to resolve MRI insurance issues. - Advise ibuprofen  800 mg as needed for pain, cautioning potential side effects. - Recommend dietary modifications to avoid greasy foods and coffee. - Send ibuprofen  refill to pharmacy.  Orders: -     Ambulatory referral to Gastroenterology -     Ibuprofen ; Take 1 tablet (800 mg total) by mouth every 8 (eight) hours as needed for moderate pain (pain score 4-6) or cramping.  Dispense: 30 tablet; Refill: 0  Vitamin D  insufficiency Assessment & Plan: Vitamin D  levels insufficient, possibly contributing to fatigue. - Recommend starting vitamin D  supplement.       Hyperlipidemia, unspecified hyperlipidemia type Assessment & Plan: Last lipid panel: LDL 149, HDL 52, Trig 112. The ASCVD Risk score (Arnett DK, et al., 2019) failed to calculate for the following reasons:   The 2019 ASCVD risk score is only valid for ages 65 to 25 Discussed lower fat diet and increased exercise. Will plan to recheck in several months and cont to monitor.     Return in about 4 months (around 10/12/2024) for HLD, RUQ pain.    Jacqueline JULIANNA Sacks, PA-C

## 2024-06-12 NOTE — Assessment & Plan Note (Signed)
 Last lipid panel: LDL 149, HDL 52, Trig 112. The ASCVD Risk score (Arnett DK, et al., 2019) failed to calculate for the following reasons:   The 2019 ASCVD risk score is only valid for ages 53 to 48 Discussed lower fat diet and increased exercise. Will plan to recheck in several months and cont to monitor.

## 2024-06-12 NOTE — Assessment & Plan Note (Signed)
 Vitamin D  levels insufficient, possibly contributing to fatigue. - Recommend starting vitamin D  supplement.

## 2024-07-22 ENCOUNTER — Other Ambulatory Visit: Payer: Self-pay

## 2024-08-11 ENCOUNTER — Ambulatory Visit: Payer: Self-pay

## 2024-08-11 NOTE — Telephone Encounter (Signed)
 Interpretor IDBETHA odor, [id number] FYI Only or Action Required?: FYI only for provider: appointment scheduled on tomorrow.  Patient was last seen in primary care on 06/11/2024 by Gayle Saddie FALCON, PA-C.  Called Nurse Triage reporting Abdominal Pain.  Symptoms began several weeks ago.  Interventions attempted: Nothing.  Symptoms are: gradually worsening.  Triage Disposition: See Physician Within 24 Hours  Patient/caregiver understands and will follow disposition?: Yes, will follow disposition  Copied from CRM #[id number]. Topic: Clinical - Red Word Triage >> Aug 11, 2024 12:13 PM Wess RAMAN wrote: Red Word that prompted transfer to Nurse Triage: Patient stated she is not feeling well.  Symptoms- lots of pain on side of stomach and back, burning during urination and bloody discharge Reason for Disposition  [1] MODERATE pain (e.g., interferes with normal activities) AND [2] pain comes and goes (cramps) AND [3] present > 24 hours  (Exception: Pain with Vomiting or Diarrhea - see that Guideline.)  Answer Assessment - Initial Assessment Questions 1. LOCATION: Where does it hurt?      RLQ 2. RADIATION: Does the pain shoot anywhere else? (e.g., chest, back)     LLQ 3. ONSET: When did the pain begin? (e.g., minutes, hours or days ago)      About 3 weeks 5. PATTERN Does the pain come and go, or is it constant?     Intermittent severity 6. SEVERITY: How bad is the pain?  (e.g., Scale 1-10; mild, moderate, or severe)     Hurts worst when supine, 7 7. RECURRENT SYMPTOM: Have you ever had this type of stomach pain before? If Yes, ask: When was the last time? and What happened that time?      Has had this before and was told she had kidney stones, has been checked and told the stones had passed in the past 8. CAUSE: What do you think is causing the stomach pain? (e.g., gallstones, recent abdominal surgery)     unsure 9. RELIEVING/AGGRAVATING FACTORS: What makes it better or worse?  (e.g., antacids, bending or twisting motion, bowel movement)     Laying flat 10. OTHER SYMPTOMS: Do you have any other symptoms? (e.g., back pain, diarrhea, fever, urination pain, vomiting)       Burning during urination, blood in urine 11. PREGNANCY: Is there any chance you are pregnant? When was your last menstrual period?       Denies states has implant in arm  Post partum-11 months.  Protocols used: Abdominal Pain - Female-A-AH

## 2024-08-12 ENCOUNTER — Encounter: Payer: Self-pay | Admitting: Family Medicine

## 2024-08-12 ENCOUNTER — Ambulatory Visit: Admitting: Family Medicine

## 2024-08-12 VITALS — BP 112/75 | HR 87 | Ht 61.0 in | Wt 171.4 lb

## 2024-08-12 DIAGNOSIS — R10A1 Flank pain, right side: Secondary | ICD-10-CM | POA: Insufficient documentation

## 2024-08-12 LAB — POCT URINALYSIS DIP (CLINITEK)
Bilirubin, UA: NEGATIVE
Glucose, UA: NEGATIVE mg/dL
Ketones, POC UA: NEGATIVE mg/dL
Nitrite, UA: NEGATIVE
POC PROTEIN,UA: NEGATIVE
Spec Grav, UA: 1.01 (ref 1.010–1.025)
Urobilinogen, UA: 0.2 U/dL
pH, UA: 7 (ref 5.0–8.0)

## 2024-08-12 MED ORDER — SULFAMETHOXAZOLE-TRIMETHOPRIM 800-160 MG PO TABS: 1.0000 | ORAL_TABLET | Freq: Two times a day (BID) | ORAL | 0 refills | Status: AC

## 2024-08-12 MED ORDER — MELOXICAM 15 MG PO TABS: 15.0000 mg | ORAL_TABLET | Freq: Every day | ORAL | 0 refills | Status: AC

## 2024-08-12 MED ORDER — TAMSULOSIN HCL 0.4 MG PO CAPS: 0.4000 mg | ORAL_CAPSULE | Freq: Every day | ORAL | 0 refills | Status: AC

## 2024-08-12 NOTE — Progress Notes (Unsigned)
° °  Acute Office Visit  Subjective:     Patient ID: Jacqueline Watkins, female    DOB: September 16, 1985, 38 y.o.   MRN: [id number]  Chief Complaint  Patient presents with   Pain    HPI Patient is in today for    Subjective - Presents with a stabbing pain on the right side, which has been present for approximately three weeks. The pain is intermittent, varying in intensity from mild to severe. It is exacerbated by lying down. The pain radiates from the front to the back. - Reports dysuria with a burning sensation, particularly when the pain is severe. - Reports hematuria, which occurred for two days approximately one week ago. - Denies nausea or vomiting. - Denies fevers.  Medications No current medications mentioned.  PMH, PSH, FH, Social Hx - History of kidney stones, which previously passed spontaneously with the use of natural supplements. The previous episode was on the left side. - Reports amenorrhea for the past two months, which began after placement of a contraceptive implant in the arm.  ROS Constitutional: Denies fevers. GI: Denies nausea or vomiting. GU: Reports dysuria and hematuria. Denies other urinary symptoms. MSK: Reports right-sided flank pain. All other systems reviewed and are negative.   ROS      Objective:    BP 112/75   Pulse 87   Ht 5' 1 (1.549 m)   Wt 171 lb 6.4 oz (77.7 kg)   SpO2 99%   BMI 32.39 kg/m  {Vitals History (Optional):23777}  Physical Exam Gen: alert, oriented Pulm: no respiratory distress Msk: Tenderness to palpation over the right flank. No rebound or guarding. Psych: pleasant affect  No results found for any visits on 08/12/24.      Assessment & Plan:   Right flank pain Assessment & Plan: The clinical presentation is highly suspicious for a right-sided nephrolithiasis. The pain is stabbing, located in the right flank, and radiates to the back. It is associated with dysuria and recent hematuria. A previous history of  kidney stones is noted. The plan is to manage conservatively and allow for spontaneous passage of the stone. - Prescribed Flomax  once daily to assist with stone passage. - Prescribed Meloxicam  once daily for pain management. Advised to avoid concurrent use of ibuprofen  or other NSAIDs. - Advised to discontinue both medications once the pain resolves and the stone has passed. - A urine sample was collected for urinalysis to rule out a urinary tract infection. Will notify the patient if antibiotics are required based on the results. - Advised to seek care in the emergency department for severe, unmanageable pain, or for fevers and intractable vomiting. - bactrim  bic x 3d sent in for possible uti   Other orders -     Tamsulosin  HCl; Take 1 capsule (0.4 mg total) by mouth daily.  Dispense: 30 capsule; Refill: 0 -     Meloxicam ; Take 1 tablet (15 mg total) by mouth daily.  Dispense: 30 tablet; Refill: 0 -     Sulfamethoxazole -Trimethoprim ; Take 1 tablet by mouth 2 (two) times daily for 3 days.  Dispense: 6 tablet; Refill: 0     Return if symptoms worsen or fail to improve.  Toribio MARLA Slain, MD

## 2024-08-12 NOTE — Patient Instructions (Addendum)
 It was nice to see you today,  We addressed the following topics today: - I am sending in a medication called flomax  that can help you pass your kidney stone.   - I am sending in a medication called meloxicam  to help with your pain. Do not take ibuprofen  with this medication.  - Please go to the emergency department if the pain becomes severe and you are unable to manage it at home, or if you develop fevers or vomiting that you cannot control. - You can try lying in a reclined position if lying flat increases your pain. - I have sent in a prescription for bactrim  twice a day for 3 days.   Have a great day,  Rolan Slain, MD

## 2024-08-12 NOTE — Assessment & Plan Note (Addendum)
 The clinical presentation is highly suspicious for a right-sided nephrolithiasis. The pain is stabbing, located in the right flank, and radiates to the back. It is associated with dysuria and recent hematuria. A previous history of kidney stones is noted. The plan is to manage conservatively and allow for spontaneous passage of the stone. - Prescribed Flomax  once daily to assist with stone passage. - Prescribed Meloxicam  once daily for pain management. Advised to avoid concurrent use of ibuprofen  or other NSAIDs. - Advised to discontinue both medications once the pain resolves and the stone has passed. - Advised to seek care in the emergency department for severe, unmanageable pain, or for fevers and intractable vomiting. - bactrim  bic x 3d sent in for possible uti

## 2024-10-06 ENCOUNTER — Other Ambulatory Visit

## 2024-10-06 ENCOUNTER — Ambulatory Visit: Admitting: Gastroenterology

## 2024-10-12 ENCOUNTER — Ambulatory Visit
